# Patient Record
Sex: Male | Born: 1960 | Race: White | Hispanic: No | Marital: Married | Smoking: Never smoker
Health system: Southern US, Community
[De-identification: ages and names within clinical notes are randomized; demographics above are authoritative.]

## PROBLEM LIST (undated history)

## (undated) DIAGNOSIS — F319 Bipolar disorder, unspecified: Secondary | ICD-10-CM

## (undated) HISTORY — DX: Bipolar disorder, unspecified: F31.9

## (undated) HISTORY — PX: HERNIA REPAIR: SHX51

---

## 2014-08-02 ENCOUNTER — Encounter (HOSPITAL_COMMUNITY): Payer: Self-pay | Admitting: Psychiatry

## 2014-08-02 ENCOUNTER — Ambulatory Visit (INDEPENDENT_AMBULATORY_CARE_PROVIDER_SITE_OTHER): Payer: BC Managed Care – PPO | Admitting: Psychiatry

## 2014-08-02 ENCOUNTER — Encounter (INDEPENDENT_AMBULATORY_CARE_PROVIDER_SITE_OTHER): Payer: Self-pay

## 2014-08-02 VITALS — BP 136/79 | HR 75 | Ht 72.0 in | Wt 208.8 lb

## 2014-08-02 DIAGNOSIS — F310 Bipolar disorder, current episode hypomanic: Secondary | ICD-10-CM

## 2014-08-02 DIAGNOSIS — F1010 Alcohol abuse, uncomplicated: Secondary | ICD-10-CM

## 2014-08-02 DIAGNOSIS — F319 Bipolar disorder, unspecified: Secondary | ICD-10-CM

## 2014-08-02 MED ORDER — HYDROXYZINE PAMOATE 25 MG PO CAPS: 25.0000 mg | ORAL_CAPSULE | ORAL | Status: DC | PRN
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
## 2014-08-05 LAB — CBC WITH DIFFERENTIAL/PLATELET
BASOS PCT: 1 % (ref 0–1)
Basophils Absolute: 0.1 10*3/uL (ref 0.0–0.1)
EOS ABS: 0.1 10*3/uL (ref 0.0–0.7)
Eosinophils Relative: 2 % (ref 0–5)
HCT: 44.5 % (ref 39.0–52.0)
Hemoglobin: 15.9 g/dL (ref 13.0–17.0)
Lymphocytes Relative: 36 % (ref 12–46)
Lymphs Abs: 1.8 10*3/uL (ref 0.7–4.0)
MCH: 29.7 pg (ref 26.0–34.0)
MCHC: 35.7 g/dL (ref 30.0–36.0)
MCV: 83 fL (ref 78.0–100.0)
Monocytes Absolute: 0.4 10*3/uL (ref 0.1–1.0)
Monocytes Relative: 8 % (ref 3–12)
Neutro Abs: 2.7 10*3/uL (ref 1.7–7.7)
Neutrophils Relative %: 53 % (ref 43–77)
PLATELETS: 262 10*3/uL (ref 150–400)
RBC: 5.36 MIL/uL (ref 4.22–5.81)
RDW: 13.8 % (ref 11.5–15.5)
WBC: 5.1 10*3/uL (ref 4.0–10.5)

## 2014-08-05 LAB — COMPREHENSIVE METABOLIC PANEL
ALT: 18 U/L (ref 0–53)
AST: 17 U/L (ref 0–37)
Albumin: 4.6 g/dL (ref 3.5–5.2)
Alkaline Phosphatase: 63 U/L (ref 39–117)
BILIRUBIN TOTAL: 0.6 mg/dL (ref 0.2–1.2)
BUN: 11 mg/dL (ref 6–23)
CO2: 28 mEq/L (ref 19–32)
CREATININE: 0.91 mg/dL (ref 0.50–1.35)
Calcium: 10.4 mg/dL (ref 8.4–10.5)
Chloride: 106 mEq/L (ref 96–112)
Glucose, Bld: 89 mg/dL (ref 70–99)
Potassium: 4.4 mEq/L (ref 3.5–5.3)
Sodium: 140 mEq/L (ref 135–145)
Total Protein: 6.8 g/dL (ref 6.0–8.3)

## 2014-08-05 LAB — HEMOGLOBIN A1C
Hgb A1c MFr Bld: 5.1 % (ref <5.7)
Mean Plasma Glucose: 100 mg/dL (ref <117)

## 2014-08-05 LAB — LIPID PANEL
Cholesterol: 222 mg/dL — ABNORMAL HIGH (ref 0–200)
HDL: 45 mg/dL (ref >39)
LDL Cholesterol: 147 mg/dL — ABNORMAL HIGH (ref 0–99)
TRIGLYCERIDES: 151 mg/dL — AB (ref <150)
Total CHOL/HDL Ratio: 4.9 Ratio
VLDL: 30 mg/dL (ref 0–40)

## 2014-08-16 ENCOUNTER — Encounter (HOSPITAL_COMMUNITY): Payer: Self-pay | Admitting: Psychiatry

## 2014-08-16 ENCOUNTER — Ambulatory Visit (INDEPENDENT_AMBULATORY_CARE_PROVIDER_SITE_OTHER): Payer: BC Managed Care – PPO | Admitting: Psychiatry

## 2014-08-16 VITALS — BP 124/82 | HR 79 | Ht 72.0 in | Wt 210.0 lb

## 2014-08-16 DIAGNOSIS — F310 Bipolar disorder, current episode hypomanic: Secondary | ICD-10-CM

## 2014-08-16 DIAGNOSIS — F1010 Alcohol abuse, uncomplicated: Secondary | ICD-10-CM

## 2014-08-16 DIAGNOSIS — F319 Bipolar disorder, unspecified: Secondary | ICD-10-CM

## 2014-08-16 NOTE — Progress Notes (Signed)
[place] Progress Note  Tom [name] [id number] 53 y.o.  08/16/2014 11:21 AM  Chief Complaint:  I took Vistaril and it did help me sleep.  I'm feeling better.   History of Present Illness:  Tom [name] came for his follow-up appointment with his wife.  He was seen first time 3 weeks ago as initial evaluation.  Patient has history of bipolar disorder and recently moved from [place].  He wanted to establish care with a psychiatrist.  On his last visit he admitted irritability and some time frustration and anger and he was given Vistaril.  He is taking Lamictal 200 mg 2 tablet daily.  Today he brought bottles of the medication.  He has not taken Ativan in a bottle.  Patient and his wife admitted that he is doing very well on his medication.  He still have some time racing thoughts and decreased energy but he denies any anger, mood swing or any paranoia.  Patient continues to get distracted easily and his mood remains labile.  Patient is concerned about his pending DWI charges from December 2015.  He denies using any illegal substances.  He denies any suicidal parts or homicidal thought.  He denies any hallucination or any paranoia however he has some grandiosity.  He has no rash or itching.  Patient does not want to increase his medication but agreed to see a counselor for coping and social skills.  At this time his wife also agreed with the plan.  He had a blood work which I reviewed.  He has a high cholesterol.  He has not able to establish a primary care physician in this area but he is thinking to contact his wife's primary care physician for his health needs.  His appetite is okay.  His vitals are stable.  Patient lives with his wife who is very supportive.  He has no children.  Suicidal Ideation: No Plan Formed: No Patient has means to carry out plan: No  Homicidal Ideation: No Plan Formed: No Patient has means to carry out plan: No  Past Psychiatric  History/Hospitalization(s) Patient endorsed history of bipolar disorder since he was 53 years old.  He has at least one brief psychiatric hospitalization in [place] for 72 hours when he was very agitated aggressive and violent.  In the past he had tried Zoloft, Prozac and lithium with good response until it stopped working.  He had also tried Depakote, Abilify which did not help him.  Patient endorses history of severe mood swings, impulsive behavior, manic symptoms which includes excessive shopping, spending, delusions, grandiosity and hyperactivity.  He endorses history of punching in the walls when he is very aggressive involvement.  He has a history of legal issues in the past.  He was charged for disorderly conduct and recently DWI .  This is still pending in court.  Patient denies any history of suicidal attempt or any paranoia. Anxiety: Yes Bipolar Disorder: Yes Depression: Yes Mania: Yes Psychosis: Yes Schizophrenia: No Personality Disorder: No Hospitalization for psychiatric illness: Yes History of Electroconvulsive Shock Therapy: No Prior Suicide Attempts: No  Medical History; Patient has no active medical problems.  He is looking for a new primary care physician.  He denies any history of seizures.  Education and Work History; The patient has a college education.  Currently he is unemployed but he has worked in many areas.  He studied and then worked in [place].  He knows multiple language.  He also worked as a Personal assistant  agent, school teacher and in IT.  Review of Systems: Psychiatric: Agitation: Yes Hallucination: No Depressed Mood: Yes Insomnia: No Hypersomnia: No Altered Concentration: No Feels Worthless: No Grandiose Ideas: Yes Belief In Special Powers: No New/Increased Substance Abuse: Yes Compulsions: No  Neurologic: Headache: No Seizure: No Paresthesias: No   Musculoskeletal: Strength & Muscle Tone: within normal limits Gait & Station: normal Patient  leans: N/A   Outpatient Encounter Prescriptions as of 08/16/2014  Medication Sig  . hydrOXYzine (VISTARIL) 25 MG capsule Take 1 capsule (25 mg total) by mouth as needed for anxiety.  . lamoTRIgine (LAMICTAL) 200 MG tablet 400 mg daily.     Recent Results (from the past 2160 hour(s))  CBC WITH DIFFERENTIAL     Status: None   Collection Time    08/05/14 10:41 AM      Result Value Ref Range   WBC 5.1  4.0 - 10.5 K/uL   RBC 5.36  4.22 - 5.81 MIL/uL   Hemoglobin 15.9  13.0 - 17.0 g/dL   HCT 44.5  39.0 - 52.0 %   MCV 83.0  78.0 - 100.0 fL   MCH 29.7  26.0 - 34.0 pg   MCHC 35.7  30.0 - 36.0 g/dL   RDW 13.8  11.5 - 15.5 %   Platelets 262  150 - 400 K/uL   Neutrophils Relative % 53  43 - 77 %   Neutro Abs 2.7  1.7 - 7.7 K/uL   Lymphocytes Relative 36  12 - 46 %   Lymphs Abs 1.8  0.7 - 4.0 K/uL   Monocytes Relative 8  3 - 12 %   Monocytes Absolute 0.4  0.1 - 1.0 K/uL   Eosinophils Relative 2  0 - 5 %   Eosinophils Absolute 0.1  0.0 - 0.7 K/uL   Basophils Relative 1  0 - 1 %   Basophils Absolute 0.1  0.0 - 0.1 K/uL   Smear Review Criteria for review not met    COMPREHENSIVE METABOLIC PANEL     Status: None   Collection Time    08/05/14 10:41 AM      Result Value Ref Range   Sodium 140  135 - 145 mEq/L   Potassium 4.4  3.5 - 5.3 mEq/L   Chloride 106  96 - 112 mEq/L   CO2 28  19 - 32 mEq/L   Glucose, Bld 89  70 - 99 mg/dL   BUN 11  6 - 23 mg/dL   Creat 0.91  0.50 - 1.35 mg/dL   Total Bilirubin 0.6  0.2 - 1.2 mg/dL   Alkaline Phosphatase 63  39 - 117 U/L   AST 17  0 - 37 U/L   ALT 18  0 - 53 U/L   Total Protein 6.8  6.0 - 8.3 g/dL   Albumin 4.6  3.5 - 5.2 g/dL   Calcium 10.4  8.4 - 10.5 mg/dL  HEMOGLOBIN A1C     Status: None   Collection Time    08/05/14 10:41 AM      Result Value Ref Range   Hemoglobin A1C 5.1  <5.7 %   Comment:  According to the ADA Clinical Practice Recommendations for 2011, when      HbA1c is used as a screening test:             >=6.5%   Diagnostic of Diabetes Mellitus                (if abnormal result is confirmed)           5.7-6.4%   Increased risk of developing Diabetes Mellitus           References:Diagnosis and Classification of Diabetes Mellitus,Diabetes     YPPJ,0932,67(TIWPY 1):S62-S69 and Standards of Medical Care in             Diabetes - 2011,Diabetes [id number],3382,50 (Suppl 1):S11-S61.         Mean Plasma Glucose 100  <117 mg/dL  LIPID PANEL     Status: Abnormal   Collection Time    08/05/14 10:41 AM      Result Value Ref Range   Cholesterol 222 (*) 0 - 200 mg/dL   Comment: ATP III Classification:           < 200        mg/dL        Desirable          200 - 239     mg/dL        Borderline High          >= 240        mg/dL        High         Triglycerides 151 (*) <150 mg/dL   HDL 45  >39 mg/dL   Total CHOL/HDL Ratio 4.9     VLDL 30  0 - 40 mg/dL   LDL Cholesterol 147 (*) 0 - 99 mg/dL   Comment:       Total Cholesterol/HDL Ratio:CHD Risk                            Coronary Heart Disease Risk Table                                            Men       Women              1/2 Average Risk              3.4        3.3                  Average Risk              5.0        4.4               2X Average Risk              9.6        7.1               3X Average Risk             23.4       11.0     Use the calculated Patient Ratio above and the CHD Risk table      to determine the patient's CHD Risk.     ATP III Classification (LDL):           <  100        mg/dL         Optimal          100 - 129     mg/dL         Near or Above Optimal          130 - 159     mg/dL         Borderline High          160 - 189     mg/dL         High           > 190        mg/dL         Very High            Constitutional:  BP 124/82  Pulse 79  Ht 6' (1.829 m)  Wt 210 lb (95.255 kg)  BMI 28.47 kg/m2   Mental Status Examination;  Patient is casually dressed  and fairly groomed.  He appears to be in his stated age.  He is cooperative  And maintained fair eye contact.  His speech is fast  but clear and coherent.  His thought processes circumstantial.  His attention and concentration is distracted.  He appears grandiose.  He described his mood anxious and his affect is appropriate.   he uses jokes and humor during the conversation.  His fund of knowledge is good.  His psychomotor activity is slightly increased.  There were no paranoia or any delusions.   he denies any active or passive suicidal parts or homicidal thought.  There were no tremors or shakes.  He is alert and oriented x3.  His insight judgment and impulse control is okay.   Established Problem, Stable/Improving (1), Review of Psycho-Social Stressors (1), Review or order clinical lab tests (1), Review and summation of old records (2), Review of Last Therapy Session (1), Review of Medication Regimen & Side Effects (2) and Review of New Medication or Change in Dosage (2)  Assessment: Axis I: Bipolar disorder, type I.  Alcohol abuse  Axis II: Deferred  Axis III:  History reviewed. No pertinent past medical history.  Axis IV: Mild to moderate   Plan:   I am is still reading collateral information from his previous psychiatrist.  At this time patient and his 5 does not want to change the medication.  He has taken Vistaril a few times with good response.  Continue Lamictal 400 mg daily.  I review blood work with him.  I strongly encouraged him to see primary care physician for basic health needs and high cholesterol.  Patient agreed to see a counselor in this office for coping and social skills.  We will schedule appointment with a therapist in this office.  Discussed medication side effects and benefits.  I will see him again in 2 months.  Patient is still has enough refills for his Lamictal.  Recommended to call us back if he has any question or any concern.  Time spent 25 minutes.  More than 50%  of the time spent in psychoeducation, counseling and coordination of care.  Discuss safety plan that anytime having active suicidal thoughts or homicidal thoughts then patient need to call 911 or go to the local emergency room.    ARFEEN,SYED T., MD 08/16/2014

## 2014-10-27 ENCOUNTER — Encounter (HOSPITAL_COMMUNITY): Payer: Self-pay | Admitting: Psychiatry

## 2014-10-27 ENCOUNTER — Ambulatory Visit (INDEPENDENT_AMBULATORY_CARE_PROVIDER_SITE_OTHER): Payer: BC Managed Care – PPO | Admitting: Psychiatry

## 2014-10-27 VITALS — BP 144/84 | HR 80 | Ht 72.0 in | Wt 209.6 lb

## 2014-10-27 DIAGNOSIS — F1010 Alcohol abuse, uncomplicated: Secondary | ICD-10-CM

## 2014-10-27 DIAGNOSIS — F310 Bipolar disorder, current episode hypomanic: Secondary | ICD-10-CM

## 2014-10-27 NOTE — Progress Notes (Signed)
[facility] Progress Note  Tom [name] [id number] 54 y.o.  10/27/2014 9:49 AM  Chief Complaint:  I saw primary care physician but I'm not happy with him.  He is retiring soon.  I'm feeling better and I does not require any Vistaril.   History of Present Illness:  Tom [name] came for his follow-up appointment.  He establish care with his primary care physician Dr. Tawanna [name] in [place] however he is not happy because he is going to be retired very soon.  He was given Ativan 0.5 mg 3 times a day with 3 additional refills and Lamictal 200 mg twice a day with 7 refills.  Overall his mood has been stable.  He has not required any Vistaril in past 3 months.  He had a quiet Christmas.  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  Patient like him and he wants to work with his election campaign.  Overall his mood has been stable.  He denies any irritability, anger, mood swing.  He sleeping good.  He had a blood work again at his primary care physician and he was told that there is no need to start any strong medication.  He is a still waiting for court hearing related to his DWI charges.  However he is not very eager or anxious because he know the court process is very slow and he may not get his court hearing until September to see her.  We have suggested to see a therapist however he felt he does not needed and he did not come to the appointment.  His appetite is okay.  His vitals are stable.  Patient lives with his wife who is very supportive.  Patient has no children.  Patient denies drinking or using any illegal substances.  Suicidal Ideation: No Plan Formed: No Patient has means to carry out plan: No  Homicidal Ideation: No Plan Formed: No Patient has means to carry out plan: No  Past Psychiatric History/Hospitalization(s) Patient endorsed history of bipolar disorder since he was 54 years old.  He  has at least one brief psychiatric hospitalization in [place] for 72 hours when he was very agitated aggressive and violent.  In the past he had tried Zoloft, Prozac and lithium with good response until it stopped working.  He had also tried Depakote, Abilify which did not help him.  Patient endorses history of severe mood swings, impulsive behavior, manic symptoms which includes excessive shopping, spending, delusions, grandiosity and hyperactivity.  He endorses history of punching in the walls when he is very aggressive involvement.  He has a history of legal issues in the past.  He was charged for disorderly conduct and recently DWI .  Patient denies any history of suicidal attempt or any paranoia. Anxiety: Yes Bipolar Disorder: Yes Depression: Yes Mania: Yes Psychosis: Yes Schizophrenia: No Personality Disorder: No Hospitalization for psychiatric illness: Yes History of Electroconvulsive Shock Therapy: No Prior Suicide Attempts: No  Medical History; Patient has no active medical problems.  He establish care with Dr. Tawanna [name] in [place].  He denies any history of seizures.  Education and Work History; Patient has college education.  Currently he is unemployed but he is thinking to join presidential election complain for [employer].  He has a good job experience he has studied and work Financial controller . He knows multiple language.  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  Review of Systems: Psychiatric: Agitation: No Hallucination: No Depressed Mood: No Insomnia: No Hypersomnia: No Altered Concentration: No Feels Worthless: No Grandiose Ideas: Yes Belief In Special Powers: No New/Increased Substance Abuse: Yes Compulsions: No  Neurologic: Headache: No Seizure: No Paresthesias: No   Musculoskeletal: Strength & Muscle Tone: within normal limits Gait & Station: normal Patient leans: N/A   Outpatient Encounter Prescriptions as of 10/27/2014   Medication Sig  . hydrOXYzine (VISTARIL) 25 MG capsule Take 1 capsule (25 mg total) by mouth as needed for anxiety.  . lamoTRIgine (LAMICTAL) 200 MG tablet 400 mg daily.   Marland Kitchen LORazepam (ATIVAN) 0.5 MG tablet     Recent Results (from the past 2160 hour(s))  CBC with Differential     Status: None   Collection Time: 08/05/14 10:41 AM  Result Value Ref Range   WBC 5.1 4.0 - 10.5 K/uL   RBC 5.36 4.22 - 5.81 MIL/uL   Hemoglobin 15.9 13.0 - 17.0 g/dL   HCT 44.5 39.0 - 52.0 %   MCV 83.0 78.0 - 100.0 fL   MCH 29.7 26.0 - 34.0 pg   MCHC 35.7 30.0 - 36.0 g/dL   RDW 13.8 11.5 - 15.5 %   Platelets 262 150 - 400 K/uL   Neutrophils Relative % 53 43 - 77 %   Neutro Abs 2.7 1.7 - 7.7 K/uL   Lymphocytes Relative 36 12 - 46 %   Lymphs Abs 1.8 0.7 - 4.0 K/uL   Monocytes Relative 8 3 - 12 %   Monocytes Absolute 0.4 0.1 - 1.0 K/uL   Eosinophils Relative 2 0 - 5 %   Eosinophils Absolute 0.1 0.0 - 0.7 K/uL   Basophils Relative 1 0 - 1 %   Basophils Absolute 0.1 0.0 - 0.1 K/uL   Smear Review Criteria for review not met   Comprehensive metabolic panel     Status: None   Collection Time: 08/05/14 10:41 AM  Result Value Ref Range   Sodium 140 135 - 145 mEq/L   Potassium 4.4 3.5 - 5.3 mEq/L   Chloride 106 96 - 112 mEq/L   CO2 28 19 - 32 mEq/L   Glucose, Bld 89 70 - 99 mg/dL   BUN 11 6 - 23 mg/dL   Creat 0.91 0.50 - 1.35 mg/dL   Total Bilirubin 0.6 0.2 - 1.2 mg/dL   Alkaline Phosphatase 63 39 - 117 U/L   AST 17 0 - 37 U/L   ALT 18 0 - 53 U/L   Total Protein 6.8 6.0 - 8.3 g/dL   Albumin 4.6 3.5 - 5.2 g/dL   Calcium 10.4 8.4 - 10.5 mg/dL  Hemoglobin A1c     Status: None   Collection Time: 08/05/14 10:41 AM  Result Value Ref Range   Hgb A1c MFr Bld 5.1 <5.7 %    Comment:                                                                        According to the ADA Clinical Practice Recommendations for 2011, when HbA1c is used as a screening test:     >=6.5%   Diagnostic of Diabetes Mellitus             (if abnormal  result is confirmed)   5.7-6.4%   Increased risk of developing Diabetes Mellitus   References:Diagnosis and Classification of Diabetes Mellitus,Diabetes IPJA,2505,39(JQBHA 1):S62-S69 and Standards of Medical Care in         Diabetes - 2011,Diabetes LPFX,9024,09 (Suppl 1):S11-S61.     Mean Plasma Glucose 100 <117 mg/dL  Lipid panel     Status: Abnormal   Collection Time: 08/05/14 10:41 AM  Result Value Ref Range   Cholesterol 222 (H) 0 - 200 mg/dL    Comment: ATP III Classification:       < 200        mg/dL        Desirable      200 - 239     mg/dL        Borderline High      >= 240        mg/dL        High     Triglycerides 151 (H) <150 mg/dL   HDL 45 >39 mg/dL   Total CHOL/HDL Ratio 4.9 Ratio   VLDL 30 0 - 40 mg/dL   LDL Cholesterol 147 (H) 0 - 99 mg/dL    Comment:   Total Cholesterol/HDL Ratio:CHD Risk                        Coronary Heart Disease Risk Table                                        Men       Women          1/2 Average Risk              3.4        3.3              Average Risk              5.0        4.4           2X Average Risk              9.6        7.1           3X Average Risk             23.4       11.0 Use the calculated Patient Ratio above and the CHD Risk table  to determine the patient's CHD Risk. ATP III Classification (LDL):       < 100        mg/dL         Optimal      100 - 129     mg/dL         Near or Above Optimal      130 - 159     mg/dL         Borderline High      160 - 189     mg/dL         High       > 190        mg/dL         Very High        Constitutional:  BP 144/84 mmHg  Pulse 80  Ht 6' (1.829 m)  Wt 209 lb 9.6 oz (95.074 kg)  BMI 28.42 kg/m2  Mental Status Examination;  Patient is casually dressed and fairly groomed.  He appears to be in his stated age.  He is pleasant and cooperative.  He maintained fair eye contact.  His speech is fast  but clear and coherent.  His thought processes is logical.   His attention and concentration is distracted.  He appears grandiose.  He described his mood anxious and his affect is appropriate.   He uses jokes and humor during the conversation.  His fund of knowledge is good.  His psychomotor activity is slightly increased.  There were no paranoia or any delusions.   he denies any active or passive suicidal parts or homicidal thought.  There were no tremors or shakes.  He is alert and oriented x3.  His insight judgment and impulse control is okay.   Established Problem, Stable/Improving (1), Review of Last Therapy Session (1) and Review of Medication Regimen & Side Effects (2)  Assessment: Axis I: Bipolar disorder, type I.  Alcohol abuse  Axis II: Deferred  Axis III:  History reviewed. No pertinent past medical history.  Axis IV: Mild to moderate   Plan:  We are still awaiting collateral information from his previous psychiatrist.  His Lamictal 200 mg twice a day is recently renewed by his primary care physician with additional 7 refills along with Ativan 0.5 mg 3 times a day with 3 more refills.  However patient is not taking Ativan every day and he does not require any new prescription of hydroxyzine.  Patient does not have any rash or itching with the Lamictal.  One more time I suggested to see a therapist if he feel that he need counseling.  He will call us back to schedule appointment if needed.  I will see him again in 3 months.  Patient does not require any refills for at least 7 months.  Recommended to call us back if he has any question, concern or if he feel worsening of the symptoms.   ARFEEN,SYED T., MD 10/27/2014

## 2014-11-10 ENCOUNTER — Encounter (HOSPITAL_COMMUNITY): Payer: Self-pay | Admitting: Clinical

## 2014-11-10 ENCOUNTER — Ambulatory Visit (INDEPENDENT_AMBULATORY_CARE_PROVIDER_SITE_OTHER): Payer: BC Managed Care – PPO | Admitting: Clinical

## 2014-11-10 DIAGNOSIS — F3110 Bipolar disorder, current episode manic without psychotic features, unspecified: Secondary | ICD-10-CM | POA: Diagnosis not present
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
## 2014-11-24 ENCOUNTER — Ambulatory Visit (HOSPITAL_COMMUNITY): Payer: BC Managed Care – PPO | Admitting: Clinical

## 2014-12-08 ENCOUNTER — Encounter (HOSPITAL_COMMUNITY): Payer: Self-pay | Admitting: Clinical

## 2014-12-08 ENCOUNTER — Ambulatory Visit (INDEPENDENT_AMBULATORY_CARE_PROVIDER_SITE_OTHER): Payer: BC Managed Care – PPO | Admitting: Clinical

## 2014-12-08 DIAGNOSIS — F3110 Bipolar disorder, current episode manic without psychotic features, unspecified: Secondary | ICD-10-CM

## 2014-12-08 NOTE — Progress Notes (Signed)
   THERAPIST PROGRESS NOTE  Session Time: 8:05 - 9:05  Participation Level: Active  Behavioral Response: Casual and NeatAlertDepressed  Type of Therapy: Individual Therapy  Treatment Goals addressed: psychiatric symptoms, emotional regulation skills,   Interventions: Motivational Interviewing and Other: Grounding and mindfulness techniques  Summary: Tom [name] is a 54 y.o. male who presents with Bipolar I.   Suicidal/Homicidal: Nowithout intent/plan  Therapist Response:  Vance Peper) met with clinician for an individual session. [name] was accompanied to this session by his wife Hartland. [name] discussed his psychiatric symptoms and current life events. He shared that he was  Doing "okay" this week. He asked his wife to share her thoughts and insights about his diagnosis. She shared that she can often recognize when is he is about to have a manic episode.[name] discussed his thoughts and insights about what his wife shared. Client, client wife and clinician discussed the signs and symptoms. [name] shared that the in the beginning he will have lots of ideas, become more chatty, get careless with money, and stop sleeping.  Client and clinician discussed the importance of taking his medication diligently as prescribed. Client and clinician discussed healthy routines - food, exercise and sleep - and how they can help him stay in balance. Clinician gave client two modules on bipolar disorder which client agreed to read and complete before next session.  Clinician introduced grounding and mindfulness techniques. Clinician explained the purpose and application.  Client, client wife, and clinician practiced some together and [name] agreed to practice them until next session.   Plan: Return again in 2 weeks.  Diagnosis: Axis I: Bipolar I     Chriss Mannan A, LCSW 12/08/2014

## 2014-12-22 ENCOUNTER — Ambulatory Visit (INDEPENDENT_AMBULATORY_CARE_PROVIDER_SITE_OTHER): Payer: BC Managed Care – PPO | Admitting: Clinical

## 2014-12-22 DIAGNOSIS — F319 Bipolar disorder, unspecified: Secondary | ICD-10-CM

## 2014-12-23 ENCOUNTER — Encounter (HOSPITAL_COMMUNITY): Payer: Self-pay | Admitting: Clinical

## 2014-12-23 NOTE — Progress Notes (Signed)
   THERAPIST PROGRESS NOTE  Session Time: 9:00 - 9:55  Participation Level: Active  Behavioral Response: Casual and NeatAlertDepressed  Type of Therapy: Individual Therapy  Treatment Goals addressed: improve psychiatric symptoms, change faulty thoughts, emotional regulations skills  Interventions: CBT and Motivational Interviewing  Summary: Tom [name] is [name] 54 y.o. male who presents with Bipolar I.   Suicidal/Homicidal: Nowithout intent/plan  Therapist Response:  Tom [name] met with clinician for an individual session. Tom [name] discussed his psychiatric symptoms and his homework. Tom [name] shared that he had been feeling "blah" for the last four days. He shared that it is [name] feeling of numbness and not caring. He shared that he did not feel especially depressed but he was unmotivated to do anything and that his thoughts were slow. Client and clinician discussed what healthy coping skills he was using. Tom [name] shared that he was just accepting his emotion. Clinician introduced [name] 7 panel  cbt thought record sheet. Client and clinician used Tom [name]'s current emotional state as an example. Tom [name] identified the situation or trigger as feeling blah. He then identified his emotions and rated them. Tom [name] then Identified his  unhelpful thought. Client and clinician discussed the evidence for and against these thoughts. Tom [name] then formulated healthier alternative thoughts. Tom [name] shared that if he were to focus on the second set of thoughts his negative emotions would go down and he would be more likely to take actions that might help his mood. Tom [name] agreed to complete some homework (cbt ) on bipolar disorder.  Plan: Return again in 1-2 weeks.  Diagnosis: Axis I: Bipolar I        Tom Torry A, LCSW 12/23/2014

## 2015-01-05 ENCOUNTER — Ambulatory Visit (HOSPITAL_COMMUNITY): Payer: BC Managed Care – PPO | Admitting: Clinical

## 2015-01-27 ENCOUNTER — Ambulatory Visit (INDEPENDENT_AMBULATORY_CARE_PROVIDER_SITE_OTHER): Payer: BC Managed Care – PPO | Admitting: Psychiatry

## 2015-01-27 ENCOUNTER — Encounter (HOSPITAL_COMMUNITY): Payer: Self-pay | Admitting: Psychiatry

## 2015-01-27 VITALS — BP 132/82 | HR 101 | Ht 72.0 in | Wt 209.6 lb

## 2015-01-27 DIAGNOSIS — F1010 Alcohol abuse, uncomplicated: Secondary | ICD-10-CM

## 2015-01-27 DIAGNOSIS — F310 Bipolar disorder, current episode hypomanic: Secondary | ICD-10-CM

## 2015-01-27 NOTE — Progress Notes (Signed)
[facility] [phone] Progress Note  Tom [name] [id number] 54 y.o.  01/27/2015 9:05 AM  Chief Complaint:  Medication management and follow-up.    History of Present Illness:  Tom [name] came for his follow-up appointment.  He is taking Lamictal 200 mg twice a day.  He denies any side effects including any rash or itching.  He is no longer taking Ativan and Vistaril.  He believed his anxiety is well controlled.  He still have some irritability, grandiosity but he denies any aggression violence or any suicidal thoughts.  Patient told that he's been dealing with his illness for a lot of time and he knows how to handle it.  He started counseling with Scarlette Calico but believe it does not required .  He is getting Lamictal from his previous provider and he is still has a lot of refills.  Patient denies any rash or itching.  Patient denies any paranoia, hallucination or any self abusive behavior.  He is thinking to get retired however he has no time frame at this time.  Patient is a still waiting for the court hearing related to his DUI charges.  Patient lives with his wife is very supportive.  His appetite is okay.  His vitals are stable.  Suicidal Ideation: No Plan Formed: No Patient has means to carry out plan: No  Homicidal Ideation: No Plan Formed: No Patient has means to carry out plan: No  Past Psychiatric History/Hospitalization(s) Patient endorsed history of bipolar disorder since he was 54 years old.  He has at least one brief psychiatric hospitalization in [place] for 72 hours when he was very agitated aggressive and violent.  In the past he had tried Zoloft, Prozac and lithium with good response until it stopped working.  He had also tried Depakote, Abilify which did not help him.  He was also given Vistaril and Ativan for anxiety however he stopped taking it.  Patient endorses history of severe mood swings, impulsive behavior, manic symptoms which includes excessive shopping, spending,  delusions, grandiosity and hyperactivity.  He endorses history of punching in the walls when he is very aggressive involvement.  He has a history of legal issues in the past.  He was charged for disorderly conduct and recently DWI .  Patient denies any history of suicidal attempt or any paranoia. Anxiety: Yes Bipolar Disorder: Yes Depression: Yes Mania: Yes Psychosis: Yes Schizophrenia: No Personality Disorder: No Hospitalization for psychiatric illness: Yes History of Electroconvulsive Shock Therapy: No Prior Suicide Attempts: No  Medical History; Patient has no active medical problems.  He establish care with Dr. Lula Olszewski in [place].  He denies any history of seizures.  Education and Work History; Patient has college education.  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 He knows multiple language.  He worked as a Customer service manager, Engineer, site and in Consulting civil engineer.  Review of Systems: Psychiatric: Agitation: No Hallucination: No Depressed Mood: No Insomnia: No Hypersomnia: No Altered Concentration: No Feels Worthless: No Grandiose Ideas: Yes Belief In Special Powers: No New/Increased Substance Abuse: No Compulsions: No  Neurologic: Headache: No Seizure: No Paresthesias: No   Musculoskeletal: Strength & Muscle Tone: within normal limits Gait & Station: normal Patient leans: N/A   Outpatient Encounter Prescriptions as of 01/27/2015  Medication Sig  . lamoTRIgine (LAMICTAL) 200 MG tablet 400 mg daily.   . [DISCONTINUED] hydrOXYzine (VISTARIL) 25 MG capsule Take 1 capsule (25 mg total) by mouth as needed for anxiety. (Patient not taking:  Reported on 11/10/2014)  . [DISCONTINUED] LORazepam (ATIVAN) 0.5 MG tablet     No results found for this or any previous visit (from the past 2160 hour(s)).    Constitutional:  BP 132/82 mmHg  Pulse 101  Ht 6' (1.829 m)  Wt 209 lb 9.6 oz (95.074 kg)  BMI 28.42 kg/m2   Mental Status Examination;   Patient is casually dressed and fairly groomed.  He is anxious but cooperative.  He maintained fair eye contact.  His speech is fast  but clear and coherent.  His thought processes is logical.  His attention and concentration is fair.  He appears grandiose.  He described his mood anxious and his affect is appropriate.   He uses jokes and humor during the conversation.  His fund of knowledge is good.  His psychomotor activity is slightly increased.  There were no paranoia or any delusions.   he denies any active or passive suicidal parts or homicidal thought.  There were no tremors or shakes.  He is alert and oriented x3.  His insight judgment and impulse control is okay.   Established Problem, Stable/Improving (1), Review of Last Therapy Session (1) and Review of Medication Regimen & Side Effects (2)  Assessment: Axis I: Bipolar disorder, type I.  Alcohol abuse  Axis II: Deferred  Axis III:  Past Medical History  Diagnosis Date  . Bipolar disorder     Plan:  Patient is no longer taking Ativan and Vistaril for more than 6 months.  He likes to continue Lamictal 200 mg twice a day.  He has no rash or itching.  He is also not interested in counseling.  Discussed medication side effects and benefits.  Recommended to call us back if he has any question, concern or if he feel worsening of the symptom.  He still has additional refills remaining for his Lamictal.  He will call us once he about ran out.  We are still awaiting collateral information from his previous psychiatrist.  Follow-up in 3 months.  Jenavive Lamboy T., MD 01/27/2015

## 2015-03-18 ENCOUNTER — Telehealth (HOSPITAL_COMMUNITY): Payer: Self-pay

## 2015-03-18 NOTE — Telephone Encounter (Signed)
Telephone message left for patient after he left a message at 3:14pm requesting Dr. Lolly MustacheArfeen provide him with a refill of Ativan.  Patient's last evaluation note from 01/27/15 stated patient was no longer taking Ativan and had been off of it for over 6 months at the time.  Attempted to reach patient to follow up on need as patient stated on message if he did not get medicine he may have to check himself into the hospital.  Requested patient call this nurse back if obtained the message before closing and instructed patient Dr. Lolly MustacheArfeen would be back in the office on Tuesday 03/22/15.  Instructed patient on message if he felt the need to be evaluated he could come [facility] for an evaluation 24/7 or to any of the EDs.

## 2015-03-23 NOTE — Telephone Encounter (Signed)
Attempted to reach patient but had to leave a message for call back.

## 2015-04-28 ENCOUNTER — Ambulatory Visit (HOSPITAL_COMMUNITY): Payer: BC Managed Care – PPO | Admitting: Psychiatry

## 2015-06-02 ENCOUNTER — Ambulatory Visit (INDEPENDENT_AMBULATORY_CARE_PROVIDER_SITE_OTHER): Payer: BC Managed Care – PPO | Admitting: Psychiatry

## 2015-06-02 ENCOUNTER — Encounter (HOSPITAL_COMMUNITY): Payer: Self-pay | Admitting: Psychiatry

## 2015-06-02 VITALS — BP 124/76 | HR 64 | Ht 72.0 in | Wt 196.2 lb

## 2015-06-02 DIAGNOSIS — F310 Bipolar disorder, current episode hypomanic: Secondary | ICD-10-CM

## 2015-06-02 DIAGNOSIS — F1021 Alcohol dependence, in remission: Secondary | ICD-10-CM | POA: Diagnosis not present

## 2015-06-02 MED ORDER — LAMOTRIGINE 200 MG PO TABS: 400.0000 mg | ORAL_TABLET | Freq: Every day | ORAL | Status: DC

## 2015-06-02 NOTE — Progress Notes (Signed)
[facility] [id number] Progress Note  Tom [name] [id number] 54 y.o.  06/02/2015 8:48 AM  Chief Complaint:  Medication management and follow-up.    History of Present Illness:  Tom [name] came for his follow-up appointment.  He was last seen in April.  She is taking Lamictal 400 mg daily.  He has no rash or itching.  He still have episodes of irritability and frustration but he denies any major mood swings or anger issues.  He sleeping good.  He has no rash or itching.  He has no major panic attack in recent months.  Patient knows his illness very well and he believed that he can control his episodic irritability very well.  He denies any paranoia or any hallucination.  He denies any feeling of hopelessness or any anhedonia.  He denies any recent impulsive behavior or any manic symptoms. He's been active social and doing regular exercise.  He lost weight from the past.  His appetite is okay.  Currently he is not working but patient to plan that he does not need to work.  He has no children and he lives with his wife who is very supportive.  Patient denies drinking or using any illegal substances.  His been looking for primary care physician.  He reported that he did not connect with Dr. Doristine Counter very well.  His last blood work was done in October which was normal..  Suicidal Ideation: No Plan Formed: No Patient has means to carry out plan: No  Homicidal Ideation: No Plan Formed: No Patient has means to carry out plan: No  Past Psychiatric History/Hospitalization(s) Patient has bipolar disorder since age 38.  He has at least one brief psychiatric hospitalization in [place] for 72 hours when he was very agitated aggressive and violent.  In the past he had tried Zoloft, Prozac and lithium with good response until it stopped working.  He had also tried Depakote, Abilify which did not help him.  He was also given Vistaril and Ativan for anxiety however he stopped taking it.  Patient endorses  history of severe mood swings, impulsive behavior, manic symptoms which includes excessive shopping, spending, delusions, grandiosity and hyperactivity.  He endorses history of punching in the walls when he is very aggressive involvement.  He has a history of legal issues in the past.  He was charged for disorderly conduct and DWI .  Patient denies any history of suicidal attempt or any paranoia. Anxiety: Yes Bipolar Disorder: Yes Depression: Yes Mania: Yes Psychosis: Yes Schizophrenia: No Personality Disorder: No Hospitalization for psychiatric illness: Yes History of Electroconvulsive Shock Therapy: No Prior Suicide Attempts: No  Medical History; Patient has no active medical problems.  He has seen in the past Dr. Lula Olszewski in [place] but now looking for a new primary care physician. He denies any history of seizures.  Review of Systems: Psychiatric: Agitation: No Hallucination: No Depressed Mood: No Insomnia: No Hypersomnia: No Altered Concentration: No Feels Worthless: No Grandiose Ideas: Yes Belief In Special Powers: No New/Increased Substance Abuse: No Compulsions: No  Neurologic: Headache: No Seizure: No Paresthesias: No   Musculoskeletal: Strength & Muscle Tone: within normal limits Gait & Station: normal Patient leans: N/A   Outpatient Encounter Prescriptions as of 06/02/2015  Medication Sig  . lamoTRIgine (LAMICTAL) 200 MG tablet Take 2 tablets (400 mg total) by mouth daily.  . [DISCONTINUED] lamoTRIgine (LAMICTAL) 200 MG tablet 400 mg daily.    No facility-administered encounter medications on file as of 06/02/2015.  No results found for this or any previous visit (from the past 2160 hour(s)).    Constitutional:  BP 124/76 mmHg  Pulse 64  Ht 6' (1.829 m)  Wt 196 lb 3.2 oz (88.996 kg)  BMI 26.60 kg/m2  Psychiatric Specialty Exam: Physical Exam  Review of Systems  Constitutional: Positive for weight loss.  Musculoskeletal: Negative.    Skin: Negative for itching and rash.  Neurological: Negative for dizziness and tremors.    There were no vitals taken for this visit.There is no weight on file to calculate BMI.  General Appearance: Casual  Eye Contact::  Good  Speech:  Normal Rate  Volume:  Normal  Mood:  Euthymic  Affect:  Appropriate  Thought Process:  Coherent  Orientation:  Full (Time, Place, and Person)  Thought Content:  WDL  Suicidal Thoughts:  No  Homicidal Thoughts:  No  Memory:  Immediate;   Fair Recent;   Fair Remote;   Good  Judgement:  Good  Insight:  Good  Psychomotor Activity:  Normal  Concentration:  Good  Recall:  Good  Fund of Knowledge:  Good  Language:  Good  Akathisia:  No  Handed:  Right  AIMS (if indicated):     Assets:  Communication Skills Desire for Improvement Financial Resources/Insurance Housing Physical Health Social Support  ADL's:  Intact  Cognition:  WNL  Sleep:      Established Problem, Stable/Improving (1), Review of Last Therapy Session (1) and Review of Medication Regimen & Side Effects (2)  Assessment: Axis I: Bipolar disorder, type I.  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 Bipolar disorder     Plan:  Patient is stable on his Lamictal.  He has no rash or itching.  He is looking for a new primary care physician.  I have provided contact information for Dr. Rene Paci at [facility] for physical checkup.  I will continue Lamictal 200 mg 2 tablet daily. He is also not interested in counseling.  Discussed medication side effects and benefits.  Recommended to call us back if he has any question, concern or if he feel worsening of the symptom.  Follow-up in 3 months.  Garnett Rekowski T., MD 06/02/2015

## 2015-09-05 ENCOUNTER — Ambulatory Visit (INDEPENDENT_AMBULATORY_CARE_PROVIDER_SITE_OTHER): Payer: BC Managed Care – PPO | Admitting: Psychiatry

## 2015-09-05 ENCOUNTER — Encounter (HOSPITAL_COMMUNITY): Payer: Self-pay | Admitting: Psychiatry

## 2015-09-05 VITALS — BP 136/84 | HR 60 | Ht 72.0 in | Wt 193.6 lb

## 2015-09-05 DIAGNOSIS — F310 Bipolar disorder, current episode hypomanic: Secondary | ICD-10-CM

## 2015-09-05 DIAGNOSIS — F1021 Alcohol dependence, in remission: Secondary | ICD-10-CM

## 2015-09-05 MED ORDER — LAMOTRIGINE 200 MG PO TABS: 400.0000 mg | ORAL_TABLET | Freq: Every day | ORAL | Status: DC

## 2015-09-05 NOTE — Progress Notes (Signed)
[facility] [id number] Progress Note  Tom GlassRobert [name] [id number] 54 y.o.  09/05/2015 8:59 AM  Chief Complaint:  Medication management and follow-up.    History of Present Illness:  Tom MaduroRobert came for his follow-up appointment.  He admitted that he's been disappointed from the election results.  In the beginning he has difficulty sleeping but now he is feeling better.  He is planning to [place].  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  He is taking his medication and denies any side effects including any rash, itching or any headaches.  He had decided to keep his primary care physician due to insurance reason.  He denies any irritability, anger, mood swing.  He denies any mania or any psychosis.  He remains sober from alcohol and he feels proud of it.  He denies any feeling of hopelessness or worthlessness.  He has no delusions or any aggressive behavior.  His appetite is okay.  His vitals are stable.  Patient lives with his wife.  He has no children was very supportive.  He denies any illegal substance use.  Suicidal Ideation: No Plan Formed: No Patient has means to carry out plan: No  Homicidal Ideation: No Plan Formed: No Patient has means to carry out plan: No  Past Psychiatric History/Hospitalization(s) Patient has bipolar disorder since age 54.  He has at least one brief psychiatric hospitalization in [place] for 72 hours when he was very agitated aggressive and violent.  In the past he had tried Zoloft, Prozac and lithium with good response until it stopped working.  He had also tried Depakote, Abilify which did not help him.  He was also given Vistaril and Ativan for anxiety however he stopped taking it.  Patient endorses history of severe mood swings, impulsive behavior, manic symptoms which includes excessive shopping, spending, delusions, grandiosity and hyperactivity.  He endorses history of punching in the  walls when he is very aggressive involvement.  He has a history of legal issues in the past.  He was charged for disorderly conduct and DWI .  Patient denies any history of suicidal attempt or any paranoia. Anxiety: Yes Bipolar Disorder: Yes Depression: Yes Mania: Yes Psychosis: Yes Schizophrenia: No Personality Disorder: No Hospitalization for psychiatric illness: Yes History of Electroconvulsive Shock Therapy: No Prior Suicide Attempts: No  Medical History; Patient has no active medical problems.  He has seen in the past Dr. Lula OlszewskiBrett Burnett in [place] but now looking for a new primary care physician. He denies any history of seizures.  Review of Systems: Psychiatric: Agitation: No Hallucination: No Depressed Mood: No Insomnia: No Hypersomnia: No Altered Concentration: No Feels Worthless: No Grandiose Ideas: No Belief In Special Powers: No New/Increased Substance Abuse: No Compulsions: No  Neurologic: Headache: No Seizure: No Paresthesias: No   Musculoskeletal: Strength & Muscle Tone: within normal limits Gait & Station: normal Patient leans: N/A   Outpatient Encounter Prescriptions as of 09/05/2015  Medication Sig  . lamoTRIgine (LAMICTAL) 200 MG tablet Take 2 tablets (400 mg total) by mouth daily.  . naproxen (NAPROSYN) 250 MG tablet Take by mouth as needed.  . [DISCONTINUED] lamoTRIgine (LAMICTAL) 200 MG tablet Take 2 tablets (400 mg total) by mouth daily.   No facility-administered encounter medications on file as of 09/05/2015.    No results found for this or any previous visit (from the past 2160 hour(s)).    Constitutional:  BP 136/84 mmHg  Pulse 60  Ht 6' (1.829 m)  Wt 193 lb 9.6 oz (87.816 kg)  BMI 26.25 kg/m2  Psychiatric Specialty Exam: Physical Exam  Review of Systems  Musculoskeletal: Negative.   Skin: Negative for itching and rash.  Neurological: Negative for dizziness and tremors.    Blood pressure 136/84, pulse 60, height 6'  (1.829 m), weight 193 lb 9.6 oz (87.816 kg).Body mass index is 26.25 kg/(m^2).  General Appearance: Casual  Eye Contact::  Good  Speech:  Normal Rate  Volume:  Normal  Mood:  Euthymic  Affect:  Appropriate  Thought Process:  Coherent  Orientation:  Full (Time, Place, and Person)  Thought Content:  WDL  Suicidal Thoughts:  No  Homicidal Thoughts:  No  Memory:  Immediate;   Fair Recent;   Fair Remote;   Good  Judgement:  Good  Insight:  Good  Psychomotor Activity:  Normal  Concentration:  Good  Recall:  Good  Fund of Knowledge:  Good  Language:  Good  Akathisia:  No  Handed:  Right  AIMS (if indicated):     Assets:  Communication Skills Desire for Improvement Financial Resources/Insurance Housing Physical Health Social Support  ADL's:  Intact  Cognition:  WNL  Sleep:      Established Problem, Stable/Improving (1), Review of Last Therapy Session (1) and Review of Medication Regimen & Side Effects (2)  Assessment: Axis I: Bipolar disorder, type I.  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 Bipolar disorder (HCC)     Plan:  Patient is stable on his Lamictal.  He wants to continue current dose of Lamictal.  He has no rash or itching.  I will continue Lamictal 200 mg 2 tablet daily. He is also not interested in counseling.  Discussed medication side effects and benefits.  Recommended to call us back if he has any question, concern or if he feel worsening of the symptom.  Follow-up in 3 months.  Tom [name] T., MD 09/05/2015

## 2015-11-21 ENCOUNTER — Other Ambulatory Visit (HOSPITAL_COMMUNITY): Payer: Self-pay | Admitting: Psychiatry

## 2015-11-22 ENCOUNTER — Other Ambulatory Visit (HOSPITAL_COMMUNITY): Payer: Self-pay | Admitting: Psychiatry

## 2015-12-06 ENCOUNTER — Ambulatory Visit (INDEPENDENT_AMBULATORY_CARE_PROVIDER_SITE_OTHER): Payer: BC Managed Care – PPO | Admitting: Psychiatry

## 2015-12-06 ENCOUNTER — Encounter (HOSPITAL_COMMUNITY): Payer: Self-pay | Admitting: Psychiatry

## 2015-12-06 VITALS — BP 134/76 | HR 72 | Ht 72.0 in | Wt 182.2 lb

## 2015-12-06 DIAGNOSIS — F1021 Alcohol dependence, in remission: Secondary | ICD-10-CM | POA: Diagnosis not present

## 2015-12-06 DIAGNOSIS — F310 Bipolar disorder, current episode hypomanic: Secondary | ICD-10-CM

## 2015-12-06 MED ORDER — LAMOTRIGINE 200 MG PO TABS: 400.0000 mg | ORAL_TABLET | Freq: Every day | ORAL | Status: DC

## 2015-12-06 NOTE — Progress Notes (Signed)
[facility] [id number] Progress Note  Tom [name] [id number] 55 y.o.  12/06/2015 9:16 AM  Chief Complaint:  Medication management and follow-up.    History of Present Illness:  Tom [name] came for his follow-up appointment.  He is taking his medication as prescribed.  He denies any irritability, anger, mood swing.  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  He recently seen his primary care physician Dr. Haywood Pao and had blood work.  His cholesterol was high and now he is taking statin .  He denies any rash itching headaches or any shakes.  He likes Lamictal.  He denies any paranoia or any hallucination.  Initially he was disappointed from election results but lately he is been more involved in exercise walking and pleased that he has lost weight from the past.  His energy level is good.  He denies any aggressive behavior, mania or any delusions.  Patient lives with his wife who is very supportive.  He is excited about upcoming trip to [place] in May with his wife.  Patient denies drinking or using any illegal substances.  He has no children.  Suicidal Ideation: No Plan Formed: No Patient has means to carry out plan: No  Homicidal Ideation: No Plan Formed: No Patient has means to carry out plan: No  Past Psychiatric History/Hospitalization(s) Patient has bipolar disorder since age 53.  He has at least one brief psychiatric hospitalization in [place] for 72 hours when he was very agitated aggressive and violent.  In the past he had tried Zoloft, Prozac and lithium with good response until it stopped working.  He had also tried Depakote, Abilify which did not help him.  He was also given Vistaril and Ativan for anxiety however he stopped taking it.  Patient endorses history of severe mood swings, impulsive behavior, manic symptoms which includes excessive shopping, spending, delusions, grandiosity and hyperactivity.  He endorses history of punching in the walls when he is very aggressive  involvement.  He has a history of legal issues in the past.  He was charged for disorderly conduct and DWI .  Patient denies any history of suicidal attempt or any paranoia. Anxiety: Yes Bipolar Disorder: Yes Depression: Yes Mania: Yes Psychosis: Yes Schizophrenia: No Personality Disorder: No Hospitalization for psychiatric illness: Yes History of Electroconvulsive Shock Therapy: No Prior Suicide Attempts: No  Medical History; Patient has no active medical problems.  He has seen in the past Dr. Lula Olszewski in [place] but now looking for a new primary care physician. He denies any history of seizures.  Review of Systems: Psychiatric: Agitation: No Hallucination: No Depressed Mood: No Insomnia: No Hypersomnia: No Altered Concentration: No Feels Worthless: No Grandiose Ideas: No Belief In Special Powers: No New/Increased Substance Abuse: No Compulsions: No  Neurologic: Headache: No Seizure: No Paresthesias: No   Musculoskeletal: Strength & Muscle Tone: within normal limits Gait & Station: normal Patient leans: N/A   Outpatient Encounter Prescriptions as of 12/06/2015  Medication Sig  . atorvastatin (LIPITOR) 10 MG tablet 10 mg.  . lamoTRIgine (LAMICTAL) 200 MG tablet Take 2 tablets (400 mg total) by mouth daily.  . naproxen (NAPROSYN) 250 MG tablet Take by mouth as needed.  . [DISCONTINUED] lamoTRIgine (LAMICTAL) 200 MG tablet Take 2 tablets (400 mg total) by mouth daily.   No facility-administered encounter medications on file as of 12/06/2015.    No results found for this or any previous visit (from the past 2160 hour(s)).    Constitutional:  BP 134/76 mmHg  Pulse 72  Ht 6' (1.829 m)  Wt 182 lb 3.2 oz (82.645 kg)  BMI 24.71 kg/m2  Psychiatric Specialty Exam: Physical Exam  Review of Systems  Musculoskeletal: Negative.   Skin: Negative for itching and rash.  Neurological: Negative for dizziness and tremors.    Blood pressure 134/76, pulse 72,  height 6' (1.829 m), weight 182 lb 3.2 oz (82.645 kg).Body mass index is 24.71 kg/(m^2).  General Appearance: Casual  Eye Contact::  Good  Speech:  Normal Rate  Volume:  Normal  Mood:  Euthymic  Affect:  Appropriate  Thought Process:  Coherent  Orientation:  Full (Time, Place, and Person)  Thought Content:  WDL  Suicidal Thoughts:  No  Homicidal Thoughts:  No  Memory:  Immediate;   Fair Recent;   Fair Remote;   Good  Judgement:  Good  Insight:  Good  Psychomotor Activity:  Normal  Concentration:  Good  Recall:  Good  Fund of Knowledge:  Good  Language:  Good  Akathisia:  No  Handed:  Right  AIMS (if indicated):     Assets:  Communication Skills Desire for Improvement Financial Resources/Insurance Housing Physical Health Social Support  ADL's:  Intact  Cognition:  WNL  Sleep:      Established Problem, Stable/Improving (1), Review or order clinical lab tests (1), Review of Last Therapy Session (1) and Review of Medication Regimen & Side Effects (2)  Assessment: Axis I: Bipolar disorder, type I.  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 Bipolar disorder (HCC)     Plan:  I reviewed labs which was done in January 2017.  He is taking cholesterol-lowering medication and he will do scheduled to have another blood work in few weeks.  He is stable on Lamictal.  He has no rash itching or side effects.  He wants to continue Lamictal 200 mg 2 tablet daily.  Discussed medication side effects and benefits.  Encouraged to continue exercise and other walking.  Recommended to call us back if he has any question or any concern.  Follow-up in 3 months.  Ayanah Snader T., MD 12/06/2015

## 2016-02-15 ENCOUNTER — Encounter (HOSPITAL_COMMUNITY): Payer: Self-pay | Admitting: Psychiatry

## 2016-02-15 ENCOUNTER — Ambulatory Visit (INDEPENDENT_AMBULATORY_CARE_PROVIDER_SITE_OTHER): Payer: BC Managed Care – PPO | Admitting: Psychiatry

## 2016-02-15 VITALS — BP 130/78 | HR 68 | Ht 72.0 in | Wt 181.8 lb

## 2016-02-15 DIAGNOSIS — F1021 Alcohol dependence, in remission: Secondary | ICD-10-CM

## 2016-02-15 DIAGNOSIS — F310 Bipolar disorder, current episode hypomanic: Secondary | ICD-10-CM | POA: Diagnosis not present

## 2016-02-15 MED ORDER — LAMOTRIGINE 200 MG PO TABS: 400.0000 mg | ORAL_TABLET | Freq: Every day | ORAL | Status: DC

## 2016-02-15 NOTE — Progress Notes (Signed)
[facility] [id number] Progress Note  Tom GlassRobert [name] [id number] 55 y.o.  02/15/2016 9:41 AM  Chief Complaint:  Medication management and follow-up.    History of Present Illness:  Tom MaduroRobert came few weeks earlier than his is scheduled appointment.  He is very excited because he is going to [place] for 6 weeks with his wife provocation.  He is leaving and first week of May and will return in mid June.  He had to plan to see multiple places and is very excited about it.  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  He admitted lately very tired because of packing but denies any irritability, agitation, anger, mood swing.  He denies any mania or psychosis.  His energy level is okay.  He denies any rash or itching.  He likes Lamictal.  He denies any aggressive behavior or any feeling of hopelessness or worthlessness.  Patient lives with his wife is very supportive.  Patient denies drinking alcohol or using any illegal substances.  His appetite is okay.  His vitals are stable. He has no children.  Suicidal Ideation: No Plan Formed: No Patient has means to carry out plan: No  Homicidal Ideation: No Plan Formed: No Patient has means to carry out plan: No  Past Psychiatric History/Hospitalization(s) Patient has bipolar disorder since age 55.  He has at least one brief psychiatric hospitalization in [place] for 72 hours when he was very agitated aggressive and violent.  In the past he had tried Zoloft, Prozac and lithium with good response until it stopped working.  He had also tried Depakote, Abilify which did not help him.  He was also given Vistaril and Ativan for anxiety however he stopped taking it.  Patient had history of severe mood swings, impulsive behavior, manic symptoms which includes excessive shopping, spending, delusions, grandiosity and hyperactivity.  He had history of punching in the walls when he is very aggressive involvement.  He has a history  of legal issues in the past.  He was charged for disorderly conduct and DWI. Patient denies any history of suicidal attempt or any paranoia. Anxiety: Yes Bipolar Disorder: Yes Depression: Yes Mania: Yes Psychosis: Yes Schizophrenia: No Personality Disorder: No Hospitalization for psychiatric illness: Yes History of Electroconvulsive Shock Therapy: No Prior Suicide Attempts: No  Medical History; Patient has no active medical problems.  He has seen in the past Dr. Lula Olszew[place]Brett [place] in [place] but now looking for a new primary care physician. He denies any history of seizures.  Review of Systems: Psychiatric: Agitation: No Hallucination: No Depressed Mood: No Insomnia: No Hypersomnia: No Altered Concentration: No Feels Worthless: No Grandiose Ideas: No Belief In Special Powers: No New/Increased Substance Abuse: No Compulsions: No  Neurologic: Headache: No Seizure: No Paresthesias: No   Musculoskeletal: Strength & Muscle Tone: within normal limits Gait & Station: normal Patient leans: N/A   Outpatient Encounter Prescriptions as of 02/15/2016  Medication Sig  . atorvastatin (LIPITOR) 10 MG tablet 10 mg.  . lamoTRIgine (LAMICTAL) 200 MG tablet Take 2 tablets (400 mg total) by mouth daily.  . naproxen (NAPROSYN) 250 MG tablet Take by mouth as needed.  . [DISCONTINUED] lamoTRIgine (LAMICTAL) 200 MG tablet Take 2 tablets (400 mg total) by mouth daily.   No facility-administered encounter medications on file as of 02/15/2016.    No results found for this or any previous visit (from the past 2160 hour(s)).    Constitutional:  BP 130/78 mmHg  Pulse 68  Ht 6' (1.829 m)  Wt 181 lb 12.8 oz (82.464 kg)  BMI 24.65 kg/m2  Psychiatric Specialty Exam: Physical Exam  Review of Systems  Musculoskeletal: Negative.   Skin: Negative for itching and rash.  Neurological: Negative for dizziness and tremors.    Blood pressure 130/78, pulse 68, height 6' (1.829 m), weight 181 lb  12.8 oz (82.464 kg).Body mass index is 24.65 kg/(m^2).  General Appearance: Casual  Eye Contact::  Good  Speech:  Normal Rate  Volume:  Normal  Mood:  Euthymic  Affect:  Appropriate  Thought Process:  Coherent  Orientation:  Full (Time, Place, and Person)  Thought Content:  WDL  Suicidal Thoughts:  No  Homicidal Thoughts:  No  Memory:  Immediate;   Fair Recent;   Fair Remote;   Good  Judgement:  Good  Insight:  Good  Psychomotor Activity:  Normal  Concentration:  Good  Recall:  Good  Fund of Knowledge:  Good  Language:  Good  Akathisia:  No  Handed:  Right  AIMS (if indicated):     Assets:  Communication Skills Desire for Improvement Financial Resources/Insurance Housing Physical Health Social Support  ADL's:  Intact  Cognition:  WNL  Sleep:      Established Problem, Stable/Improving (1), Review or order clinical lab tests (1), Review of Last Therapy Session (1) and Review of Medication Regimen & Side Effects (2)  Assessment: Axis I: Bipolar disorder, type I.  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 Bipolar disorder (HCC)     Plan:  Patient is doing better on his current medication.  He is excited about his trip to [place].  I will continue Lamictal 200 mg daily.  He has no rash itching or any side effects. Discussed medication side effects and benefits. Recommended to call us back if he has any question or any concern.  Follow-up in 3 months.  Latifa Noble T., MD 02/15/2016

## 2016-05-16 ENCOUNTER — Ambulatory Visit (HOSPITAL_COMMUNITY): Payer: Self-pay | Admitting: Psychiatry

## 2016-06-26 ENCOUNTER — Other Ambulatory Visit (HOSPITAL_COMMUNITY): Payer: Self-pay | Admitting: Psychiatry

## 2016-06-26 DIAGNOSIS — F310 Bipolar disorder, current episode hypomanic: Secondary | ICD-10-CM

## 2016-07-17 ENCOUNTER — Ambulatory Visit (HOSPITAL_COMMUNITY): Payer: Self-pay | Admitting: Psychiatry

## 2016-08-06 ENCOUNTER — Other Ambulatory Visit (HOSPITAL_COMMUNITY): Payer: Self-pay | Admitting: Psychiatry

## 2016-08-06 DIAGNOSIS — F310 Bipolar disorder, current episode hypomanic: Secondary | ICD-10-CM

## 2016-08-07 NOTE — Telephone Encounter (Signed)
Met with Dr. Lovena Le, helping to cover for Dr. Adele Schilder out this date who approved a one time refill of patient's prescribed Lamictal 200 mg, 2 tablets by mouth daily, #60 with on refills once confirmed with [facility] this is the dosage patient has been taking for over one year and was last ordered..  [facility] order [facility] [address] as ordered and approved by Dr. Lovena Le for Lamictal 213m, 2 by mouth daily, #60 with no refills.  Patient to keep next appointment 09/11/16.

## 2016-09-11 ENCOUNTER — Encounter (HOSPITAL_COMMUNITY): Payer: Self-pay | Admitting: Psychiatry

## 2016-09-11 ENCOUNTER — Ambulatory Visit (INDEPENDENT_AMBULATORY_CARE_PROVIDER_SITE_OTHER): Payer: BC Managed Care – PPO | Admitting: Psychiatry

## 2016-09-11 DIAGNOSIS — F310 Bipolar disorder, current episode hypomanic: Secondary | ICD-10-CM | POA: Diagnosis not present

## 2016-09-11 DIAGNOSIS — Z79899 Other long term (current) drug therapy: Secondary | ICD-10-CM

## 2016-09-11 DIAGNOSIS — F1021 Alcohol dependence, in remission: Secondary | ICD-10-CM | POA: Diagnosis not present

## 2016-09-11 MED ORDER — LAMOTRIGINE 200 MG PO TABS: ORAL_TABLET | ORAL | 2 refills | Status: DC

## 2016-09-11 NOTE — Progress Notes (Signed)
[facility] [id number] Progress Note  Tom GlassRobert [name] [id number] 55 y.o.  09/11/2016 2:26 PM  Chief Complaint:  I have a wonderful trip to [place].     History of Present Illness:  Tom MaduroRobert came for his follow-up appointment.  He was last seen in April.  He's been taking his medication as prescribed and reported no side effects.  He denies any irritability, anger, mania or psychosis.  In summer he went to [place] and [place] he had a good time.  He like so much that he was thinking about moving to [place] but he does not speak [place] .  He denies any rash, itching or any headaches.  His energy level is good.  He continues to enjoy outdoor activities and now he is biking almost every day with his wife.  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  His appetite is okay.  His energy level is good.  Patient denies taking alcohol or using any illegal substances.  He likes to continue his Lamictal.  He is scheduled to see his primary care physician and coming weeks.  Patient lives with his wife .  He has no children.l   Suicidal Ideation: No Plan Formed: No Patient has means to carry out plan: No  Homicidal Ideation: No Plan Formed: No Patient has means to carry out plan: No  Past Psychiatric History/Hospitalization(s) Patient has bipolar disorder since age 55.  He has at least one brief psychiatric hospitalization in [place] for 72 hours when he was very agitated aggressive and violent.  In the past he had tried Zoloft, Prozac and lithium with good response until it stopped working.  He had also tried Depakote, Abilify which did not help him.  He was also given Vistaril and Ativan for anxiety however he stopped taking it.  Patient had history of severe mood swings, impulsive behavior, manic symptoms which includes excessive shopping, spending, delusions, grandiosity and hyperactivity.  He had history of punching in the walls when he is very aggressive involvement.  He has a  history of legal issues in the past.  He was charged for disorderly conduct and DWI. Patient denies any history of suicidal attempt or any paranoia. Anxiety: Yes Bipolar Disorder: Yes Depression: Yes Mania: Yes Psychosis: Yes Schizophrenia: No Personality Disorder: No Hospitalization for psychiatric illness: Yes History of Electroconvulsive Shock Therapy: No Prior Suicide Attempts: No  Medical History; Patient has no active medical problems.  He has seen in the past Dr. Lula OlszewskiBrett Burnett in [place] but now looking for a new primary care physician. He denies any history of seizures.  Review of Systems: Psychiatric: Agitation: No Hallucination: No Depressed Mood: No Insomnia: No Hypersomnia: No Altered Concentration: No Feels Worthless: No Grandiose Ideas: No Belief In Special Powers: No New/Increased Substance Abuse: No Compulsions: No  Neurologic: Headache: No Seizure: No Paresthesias: No   Musculoskeletal: Strength & Muscle Tone: within normal limits Gait & Station: normal Patient leans: N/A   Outpatient Encounter Prescriptions as of 09/11/2016  Medication Sig  . atorvastatin (LIPITOR) 10 MG tablet 10 mg.  . lamoTRIgine (LAMICTAL) 200 MG tablet TAKE 2 TABLETS(400 MG) BY MOUTH DAILY  . naproxen (NAPROSYN) 250 MG tablet Take by mouth as needed.   No facility-administered encounter medications on file as of 09/11/2016.     No results found for this or any previous visit (from the past 2160 hour(s)).    Constitutional:  BP 118/72   Pulse 76   Ht 6' (1.829 m)  Wt 180 lb (81.6 kg)   BMI 24.41 kg/m   Psychiatric Specialty Exam: Physical Exam  Review of Systems  Constitutional: Negative.   HENT: Negative.   Eyes: Negative.   Cardiovascular: Negative.   Musculoskeletal: Negative.   Skin: Negative.  Negative for itching and rash.    Blood pressure 118/72, pulse 76, height 6' (1.829 m), weight 180 lb (81.6 kg).Body mass index is 24.41 kg/m.  General  Appearance: Fairly Groomed  Patent attorneyye Contact::  Good  Speech:  Clear and Coherent and Normal Rate  Volume:  Normal  Mood:  Euthymic  Affect:  Appropriate and Congruent  Thought Process:  Goal Directed  Orientation:  Full (Time, Place, and Person)  Thought Content:  WDL and Logical  Suicidal Thoughts:  No  Homicidal Thoughts:  No  Memory:  Immediate;   Good Recent;   Good Remote;   Good  Judgement:  Good  Insight:  Good  Psychomotor Activity:  Normal  Concentration:  Good  Recall:  Good  Fund of Knowledge:  Good  Language:  Good  Akathisia:  No  Handed:  Right  AIMS (if indicated):     Assets:  Communication Skills Desire for Improvement Financial Resources/Insurance Housing Physical Health Social Support  ADL's:  Intact  Cognition:  WNL  Sleep:      Established Problem, Stable/Improving (1), Review of Last Therapy Session (1) and Review of Medication Regimen & Side Effects (2)  Assessment: Axis I: Bipolar disorder, type I.  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 Bipolar disorder (HCC)     Plan:  Patient is doing better on his current psychiatric medication.  He has no side effects.  Lactic continue Lamictal 200 mg 2 tablet daily.  He has no rash, itching or any side effects.  He scheduled to see his primary care physician in few weeks.  I recommended to have his blood work faxed to us.  Follow-up in 3 months Discussed medication side effects and benefits.  Recommended to call us back if there is any question, concern or worsening of the symptoms.  Discuss safety plan that anytime having active suicidal thoughts or homicidal thoughts and she need to call 911 or go to the local emergency room. [name] T., MD 09/11/2016

## 2016-12-12 ENCOUNTER — Ambulatory Visit (INDEPENDENT_AMBULATORY_CARE_PROVIDER_SITE_OTHER): Payer: BC Managed Care – PPO | Admitting: Psychiatry

## 2016-12-12 ENCOUNTER — Encounter (HOSPITAL_COMMUNITY): Payer: Self-pay | Admitting: Psychiatry

## 2016-12-12 DIAGNOSIS — Z818 Family history of other mental and behavioral disorders: Secondary | ICD-10-CM | POA: Diagnosis not present

## 2016-12-12 DIAGNOSIS — Z9889 Other specified postprocedural states: Secondary | ICD-10-CM | POA: Diagnosis not present

## 2016-12-12 DIAGNOSIS — Z811 Family history of alcohol abuse and dependence: Secondary | ICD-10-CM | POA: Diagnosis not present

## 2016-12-12 DIAGNOSIS — Z79899 Other long term (current) drug therapy: Secondary | ICD-10-CM

## 2016-12-12 DIAGNOSIS — Z813 Family history of other psychoactive substance abuse and dependence: Secondary | ICD-10-CM

## 2016-12-12 DIAGNOSIS — F310 Bipolar disorder, current episode hypomanic: Secondary | ICD-10-CM | POA: Diagnosis not present

## 2016-12-12 MED ORDER — LAMOTRIGINE 200 MG PO TABS: ORAL_TABLET | ORAL | 2 refills | Status: DC

## 2016-12-12 NOTE — Progress Notes (Signed)
BH MD/PA/NP OP Progress Note  12/12/2016 8:49 AM Tom GlassRobert [name]  MRN:  [id number]  Chief Complaint:  Subjective:  I'm stressed lately.  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  HPI: Tom MaduroRobert came for his follow-up appointment.  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  He is frustrated with the administration and the school system but hoping to start pretty soon.  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  He admitted more irritable and frustrated but denies any mania, hallucination, paranoia or any aggressive behavior.  He is sleeping 8 hours.  He is taking his medication and denies any side effects.  He does not want to add or change his medication.  He is pretty confident that once he start working he would be fine.  His wife is not tired but still works part-time with the school system.  He is excited about upcoming trip to [place] and suicidal and in May.  He started getting online classes about [place].  He denies any tremors, shakes, rash or any itching.  Recently he [facility] his primary care [name] and he had blood work.  He was told everything is normal.  Patient denies drinking alcohol or using any illegal substances.  He lives with his wife who is very supportive.  He has no children.  His appetite is okay.  His vital signs are stable.  Visit Diagnosis:    ICD-9-CM ICD-10-CM   1. Bipolar disorder, most recent episode hypomanic (HCC) 296.40 F31.0 lamoTRIgine (LAMICTAL) 200 MG tablet    Past Psychiatric History: Reviewed.  Past Medical History:  Past Medical History:  Diagnosis Date  . Bipolar disorder [facility](HCC)     Past Surgical History:  Procedure Laterality Date  . HERNIA REPAIR       Family Psychiatric History: Reviewed. Patient has bipolar disorder since age 56.  He has at least one brief psychiatric hospitalization in [place] for 72 hours when he was very agitated aggressive and violent.  In the past he had tried Zoloft, Prozac and lithium with good response until it stopped working.  He had also tried Depakote, Abilify which did not help him.  He was also given Vistaril and Ativan for anxiety however he stopped taking it.  Patient had history of severe mood swings, impulsive behavior, manic symptoms which includes excessive shopping, spending, delusions, grandiosity and hyperactivity.  He had history of punching in the walls. He has a history of legal issues in the past.  He was charged for disorderly conduct and DWI. Patient denies any history of suicidal attempt or any paranoia.  Family History:  Family History  Problem Relation Age of Onset  . Bipolar disorder Father   . Alcohol abuse Father   . Depression Mother   . Bipolar disorder Brother   . Depression Brother   . Drug abuse Brother   . Depression Brother     Social History:  Social History   Social History  . Marital status: Married    Spouse name: N/A  . Number of children: N/A  . Years of education: N/A   Social History Main Topics  . Smoking status: Never Smoker  . Smokeless tobacco: Never  Used  . Alcohol use 0.6 oz/week    1 Glasses of wine per week  . Drug use: No  . Sexual activity: Not Currently    Birth control/ protection: None   Other Topics Concern  . Not on file   Social History Narrative  . No narrative on file    Allergies: No Known Allergies  Metabolic Disorder Labs: Lab Results  Component Value Date   HGBA1C 5.1 08/05/2014   MPG 100 08/05/2014   No results found for: PROLACTIN Lab Results  Component Value Date   CHOL 222 (H) 08/05/2014   TRIG 151 (H) 08/05/2014   HDL 45 08/05/2014   CHOLHDL 4.9 08/05/2014   VLDL 30 08/05/2014   LDLCALC 147 (H) 08/05/2014      Current Medications: Current Outpatient Prescriptions  Medication Sig Dispense Refill  . atorvastatin (LIPITOR) 10 MG tablet 10 mg.  6  . lamoTRIgine (LAMICTAL) 200 MG tablet TAKE 2 TABLETS(400 MG) BY MOUTH DAILY 60 tablet 2  . naproxen (NAPROSYN) 250 MG tablet Take by mouth as needed.     No current facility-administered medications for this visit.     Neurologic: Headache: No Seizure: No Paresthesias: No  Musculoskeletal: Strength & Muscle Tone: within normal limits Gait & Station: normal Patient leans: N/A  Psychiatric Specialty Exam: Review of Systems  Constitutional: Negative.   HENT: Negative.   Musculoskeletal: Negative.   Skin: Negative.  Negative for itching and rash.  Neurological: Negative.     There were no vitals taken for this visit.There is no height or weight on file to calculate BMI.  General Appearance: Fairly Groomed  Eye Contact:  Good  Speech:  Clear and Coherent and Fast  Volume:  Normal  Mood:  Anxious  Affect:  Appropriate and Congruent  Thought Process:  Goal Directed  Orientation:  Full (Time, Place, and Person)  Thought Content: WDL and Logical   Suicidal Thoughts:  No  Homicidal Thoughts:  No  Memory:  Immediate;   Good Recent;   Good Remote;   Good  Judgement:  Good  Insight:  Good  Psychomotor Activity:  Normal  Concentration:  Concentration: Fair and Attention Span: Fair  Recall:  Good  Fund of Knowledge: Good  Language: Good  Akathisia:  No  Handed:  Right  AIMS (if indicated):    Assets:  Communication Skills Desire for Improvement Financial Resources/Insurance Housing Leisure Time Physical Health Resilience Social Support Talents/Skills Transportation  ADL's:  Intact  Cognition: WNL  Sleep:  Good     Assessment: Bipolar disorder type I.  Plan: Discuss recent irritability and frustration but patient does not want to add or change his medication.  He is confident that it is job related and once he start to  work he would be fine.  He like to continue Lamictal 400 mg daily.  He has no rash itching or any side effects.  Will contact his primary care physician for blood work which was done recently.  Follow-up in 3 months.Discussed medication side effects and benefits.  Recommended to call us back if there is any question, concern or worsening of the symptoms.  Discuss safety plan that anytime having active suicidal thoughts or homicidal thoughts and she need to call 911 or go to the local emergency room.  Marielis Samara T., MD 12/12/2016, 8:49 AM

## 2017-03-11 ENCOUNTER — Ambulatory Visit (INDEPENDENT_AMBULATORY_CARE_PROVIDER_SITE_OTHER): Payer: BC Managed Care – PPO | Admitting: Psychiatry

## 2017-03-11 ENCOUNTER — Encounter (HOSPITAL_COMMUNITY): Payer: Self-pay | Admitting: Psychiatry

## 2017-03-11 DIAGNOSIS — Z811 Family history of alcohol abuse and dependence: Secondary | ICD-10-CM | POA: Diagnosis not present

## 2017-03-11 DIAGNOSIS — Z813 Family history of other psychoactive substance abuse and dependence: Secondary | ICD-10-CM

## 2017-03-11 DIAGNOSIS — Z818 Family history of other mental and behavioral disorders: Secondary | ICD-10-CM

## 2017-03-11 DIAGNOSIS — F310 Bipolar disorder, current episode hypomanic: Secondary | ICD-10-CM | POA: Diagnosis not present

## 2017-03-11 MED ORDER — LAMOTRIGINE 200 MG PO TABS: ORAL_TABLET | ORAL | 2 refills | Status: DC

## 2017-03-11 NOTE — Progress Notes (Signed)
BH MD/PA/NP OP Progress Note  03/11/2017 10:44 AM Tom [name]  MRN:  [id number]  Chief Complaint:  Chief Complaint    Follow-up     Subjective:  I'm doing good.  I'm excited as getting [place] for 1 month with my wife.  HPI: Patient came for his follow-up appointment.  He is taking his medication as prescribed.  He denies any irritability, anger, mania or any psychosis.  He recently finished working for [employer].  He really enjoyed working with [employer].  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  Patient is excited as he is going with his wife Scarlette Calico tomorrow for 1 month.  Patient denies any mania or any suicidal thoughts.  He has no rash, itching, tremors or any shakes.  His energy level is good.  His appetite is okay.  Patient denies drinking alcohol or using any illegal substances.  His lives with his wife is very supportive.  He has no children.  His vital signs are stable.  Visit Diagnosis:    ICD-9-CM ICD-10-CM   1. Bipolar disorder, most recent episode hypomanic (HCC) 296.40 F31.0 lamoTRIgine (LAMICTAL) 200 MG tablet    Past Psychiatric History: Reviewed. Patient has bipolar disorder since age 56. He has at least one brief psychiatric hospitalization in [place] for 72 hours when he was very agitated aggressive and violent. In the past he had tried Zoloft, Prozac and lithium with good response until it stopped working. He had also tried Depakote, Abilify which did not help him. He was also given Vistaril and Ativan for anxiety however he stopped taking it. Patient had history of severe mood swings, impulsive behavior, manic symptoms which includes excessive shopping, spending, delusions, grandiosity and hyperactivity. He had history of punching in the walls. He has a history of legal issues in the past. He was charged for disorderly conduct and DWI. Patient denies any history of suicidal attempt or any paranoia.  Past Medical History:  Past Medical History:   Diagnosis Date  . Bipolar disorder [facility])     Past Surgical History:  Procedure Laterality Date  . HERNIA REPAIR      Family Psychiatric History: Reviewed.  Family History:  Family History  Problem Relation Age of Onset  . Bipolar disorder Father   . Alcohol abuse Father   . Depression Mother   . Bipolar disorder Brother   . Depression Brother   . Drug abuse Brother   . Depression Brother     Social History:  Social History   Social History  . Marital status: Married    Spouse name: N/A  . Number of children: N/A  . Years of education: N/A   Social History Main Topics  . Smoking status: Never Smoker  . Smokeless tobacco: Never Used  . Alcohol use 0.6 oz/week    1 Glasses of wine per week  . Drug use: No  . Sexual activity: Not Currently    Birth control/ protection: None   Other Topics Concern  . None   Social History Narrative  . None    Allergies: No Known Allergies  Metabolic Disorder Labs: Lab Results  Component Value Date   HGBA1C 5.1 08/05/2014   MPG 100 08/05/2014   No results found for: PROLACTIN Lab Results  Component Value Date   CHOL 222 (H) 08/05/2014   TRIG 151 (H) 08/05/2014   HDL 45 08/05/2014   CHOLHDL 4.9 08/05/2014   VLDL 30 08/05/2014   LDLCALC 147 (H) 08/05/2014  Current Medications: Current Outpatient Prescriptions  Medication Sig Dispense Refill  . atorvastatin (LIPITOR) 10 MG tablet 10 mg.  6  . lamoTRIgine (LAMICTAL) 200 MG tablet TAKE 2 TABLETS(400 MG) BY MOUTH DAILY 60 tablet 2  . naproxen (NAPROSYN) 250 MG tablet Take by mouth as needed.    . finasteride (PROSCAR) 5 MG tablet      No current facility-administered medications for this visit.     Neurologic: Headache: No Seizure: No Paresthesias: No  Musculoskeletal: Strength & Muscle Tone: within normal limits Gait & Station: normal Patient leans: N/A  Psychiatric Specialty Exam: Review of Systems  Constitutional: Negative.   HENT: Negative.    Musculoskeletal: Negative.   Skin: Negative.  Negative for itching and rash.  Neurological: Negative.     Blood pressure 122/72, pulse (!) 56, height 6' (1.829 m), weight 184 lb (83.5 kg), SpO2 98 %.Body mass index is 24.95 kg/m.  General Appearance: Casual  Eye Contact:  Good  Speech:  Clear and Coherent  Volume:  Normal  Mood:  Euthymic  Affect:  Congruent  Thought Process:  Goal Directed  Orientation:  Full (Time, Place, and Person)  Thought Content: WDL and Logical   Suicidal Thoughts:  No  Homicidal Thoughts:  No  Memory:  Immediate;   Good Recent;   Good Remote;   Good  Judgement:  Good  Insight:  Good  Psychomotor Activity:  Normal  Concentration:  Concentration: Good and Attention Span: Good  Recall:  Good  Fund of Knowledge: Good  Language: Good  Akathisia:  No  Handed:  Right  AIMS (if indicated):  0  Assets:  Communication Skills Desire for Improvement Housing Physical Health Resilience Social Support Transportation  ADL's:  Intact  Cognition: WNL  Sleep:  Good     Assessment: Bipolar disorder type I.  Plan: Patient is a stable on his current medication.  He has no rash or itching.  I will continue Lamictal 400 mg daily.  Discussed medication side effects and benefits.  Recommended to call us back if he has any question, concern or if he feel worsening of the symptom.  Follow-up in 3 months.  Elliot Meldrum T., MD 03/11/2017, 10:44 AM

## 2017-06-10 ENCOUNTER — Ambulatory Visit (INDEPENDENT_AMBULATORY_CARE_PROVIDER_SITE_OTHER): Payer: BC Managed Care – PPO | Admitting: Psychiatry

## 2017-06-10 ENCOUNTER — Encounter (HOSPITAL_COMMUNITY): Payer: Self-pay | Admitting: Psychiatry

## 2017-06-10 DIAGNOSIS — Z818 Family history of other mental and behavioral disorders: Secondary | ICD-10-CM | POA: Diagnosis not present

## 2017-06-10 DIAGNOSIS — F310 Bipolar disorder, current episode hypomanic: Secondary | ICD-10-CM

## 2017-06-10 DIAGNOSIS — Z813 Family history of other psychoactive substance abuse and dependence: Secondary | ICD-10-CM

## 2017-06-10 DIAGNOSIS — Z811 Family history of alcohol abuse and dependence: Secondary | ICD-10-CM | POA: Diagnosis not present

## 2017-06-10 MED ORDER — LAMOTRIGINE 200 MG PO TABS: ORAL_TABLET | ORAL | 2 refills | Status: DC

## 2017-06-10 NOTE — Progress Notes (Signed)
BH MD/PA/NP OP Progress Note  06/10/2017 8:32 AM Tom [name]  MRN:  [id number]  Chief Complaint:  Subjective:  I am doing good.  HPI: Patient came for his follow-up appointment.  He is taking Lamictal 400 mg in the morning.  He denies any rash or any itching.  He had a good trip to [place] where he stayed one month.  He is hoping to go back to [place] in November again with her life.  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  He sleeping good.  He denies any irritability, anger, mania, psychosis or any grandiosity.  His mood is stable.  His energy level is good.  His appetite is okay.  Patient denies drinking alcohol or using any illegal substances.  He like to continue current dose of Lamictal.  He is scheduled to see his primary care physician Dr. Nickolas Madrid in January for physical and blood work.  He lives with his wife is supportive.  He has no children.  His appetite is okay.  His vital signs are stable.  Visit Diagnosis:    ICD-10-CM   1. Bipolar disorder, most recent episode hypomanic (HCC) F31.0 lamoTRIgine (LAMICTAL) 200 MG tablet    Past Psychiatric History: Reviewed. Patient has history of bipolar disorder.  He had one brief psychiatric hospitalization for 72 hours in [place] where he was very agitated, aggressive and violent.  In the past he had tried Zoloft, Prozac and lithium with good response.  He also tried Depakote, Abilify which did not help him.  He was also given Vistaril and Ativan for anxiety but he stopped taking it.  Patient has history of severe mood swing, impulsive behavior, manic symptoms with excessive shopping buying and spending, delusions, grandiosity and hyperactivity.  Her history of punching in the walls.  He had a history of legal issues and charged for disorderly conduct and DWI.  Patient denies any history of suicidal attempt or any paranoia.  Past Medical History:   Past Medical History:  Diagnosis Date  . Bipolar disorder [facility])     Past Surgical History:  Procedure Laterality Date  . HERNIA REPAIR      Family Psychiatric History: Reviewed.  Family History:  Family History  Problem Relation Age of Onset  . Bipolar disorder Father   . Alcohol abuse Father   . Depression Mother   . Bipolar disorder Brother   . Depression Brother   . Drug abuse Brother   . Depression Brother     Social History:  Social History   Social History  . Marital status: Married    Spouse name: N/A  . Number of children: N/A  . Years of education: N/A   Social History Main Topics  . Smoking status: Never Smoker  . Smokeless tobacco: Never Used  . Alcohol use 0.6 oz/week    1 Glasses of wine per week  . Drug use: No  . Sexual activity: Not Currently    Birth control/ protection: None   Other Topics Concern  . None   Social History Narrative  . None    Allergies: No Known Allergies  Metabolic Disorder Labs: Lab Results  Component Value Date   HGBA1C 5.1 08/05/2014   MPG 100 08/05/2014   No results found for: PROLACTIN Lab Results  Component Value Date   CHOL 222 (H) 08/05/2014   TRIG 151 (H) 08/05/2014   HDL 45  08/05/2014   CHOLHDL 4.9 08/05/2014   VLDL 30 08/05/2014   LDLCALC 147 (H) 08/05/2014     Current Medications: Current Outpatient Prescriptions  Medication Sig Dispense Refill  . atorvastatin (LIPITOR) 10 MG tablet 10 mg.  6  . finasteride (PROSCAR) 5 MG tablet     . lamoTRIgine (LAMICTAL) 200 MG tablet TAKE 2 TABLETS(400 MG) BY MOUTH DAILY 60 tablet 2  . naproxen (NAPROSYN) 250 MG tablet Take by mouth as needed.     No current facility-administered medications for this visit.     Neurologic: Headache: No Seizure: No Paresthesias: No   Musculoskeletal: Strength & Muscle Tone: within normal limits Gait & Station: normal Patient leans: N/A  Psychiatric Specialty Exam: ROS  Blood pressure 128/72, pulse (!) 56,  height 6' (1.829 m), weight 189 lb 3.2 oz (85.8 kg).Body mass index is 25.66 kg/m.  General Appearance: Casual  Eye Contact:  Good  Speech:  Clear and Coherent  Volume:  Normal  Mood:  Euthymic  Affect:  Appropriate  Thought Process:  Goal Directed  Orientation:  Full (Time, Place, and Person)  Thought Content: Logical   Suicidal Thoughts:  No  Homicidal Thoughts:  No  Memory:  Immediate;   Good Recent;   Good Remote;   Good  Judgement:  Good  Insight:  Good  Psychomotor Activity:  Normal  Concentration:  Concentration: Good and Attention Span: Good  Recall:  Good  Fund of Knowledge: Good  Language: Good  Akathisia:  No  Handed:  Right  AIMS (if indicated):  0  Assets:  Communication Skills Desire for Improvement Housing Resilience Social Support  ADL's:  Intact  Cognition: WNL  Sleep:  ok    Assessment: Bipolar disorder type I.  Plan: Patient is a stable on his current medication.  He will continue Lamictal 400 mg in the morning.  He has no rash, itching, tremors or shakes.  Recommended to call us back if he has any question or any concern.  Follow-up in 3 months.  He is not interested in counseling.  Adream Parzych T., MD 06/10/2017, 8:32 AM

## 2017-06-11 ENCOUNTER — Ambulatory Visit (HOSPITAL_COMMUNITY): Payer: Self-pay | Admitting: Psychiatry

## 2017-09-10 ENCOUNTER — Ambulatory Visit (HOSPITAL_COMMUNITY): Payer: Self-pay | Admitting: Psychiatry

## 2017-09-30 ENCOUNTER — Ambulatory Visit (HOSPITAL_COMMUNITY): Payer: Self-pay | Admitting: Psychiatry

## 2017-10-09 ENCOUNTER — Ambulatory Visit (INDEPENDENT_AMBULATORY_CARE_PROVIDER_SITE_OTHER): Payer: BLUE CROSS/BLUE SHIELD | Admitting: Psychiatry

## 2017-10-09 ENCOUNTER — Encounter (HOSPITAL_COMMUNITY): Payer: Self-pay | Admitting: Psychiatry

## 2017-10-09 DIAGNOSIS — Z811 Family history of alcohol abuse and dependence: Secondary | ICD-10-CM | POA: Diagnosis not present

## 2017-10-09 DIAGNOSIS — Z79899 Other long term (current) drug therapy: Secondary | ICD-10-CM

## 2017-10-09 DIAGNOSIS — Z813 Family history of other psychoactive substance abuse and dependence: Secondary | ICD-10-CM

## 2017-10-09 DIAGNOSIS — Z818 Family history of other mental and behavioral disorders: Secondary | ICD-10-CM

## 2017-10-09 DIAGNOSIS — F310 Bipolar disorder, current episode hypomanic: Secondary | ICD-10-CM | POA: Diagnosis not present

## 2017-10-09 MED ORDER — LAMOTRIGINE 200 MG PO TABS: ORAL_TABLET | ORAL | 2 refills | Status: DC

## 2017-10-09 NOTE — Progress Notes (Signed)
BH MD/PA/NP OP Progress Note  10/09/2017 7:07 AM Tom GlassRobert [name]  MRN:  [id number]  Chief Complaint: We are moving to [place].  We are buying a house and hoping to move in few months.  HPI: Patient came for his follow-up appointment.  He is excited because they are moving to [place] and they even offered to buy a house in [place] which was accepted.  Patient is hoping that he and his wife can move in April.  At this time he is not selling his own house in [place] but like to put in a rent.  He describes his mood is very good.  He denies any irritability, anger, mania or any psychosis.  He really enjoy recent trip to [place].  He feels very energetic and he do a lot of biking and feel very relaxed.  His wife is retired from [employer].  He is hoping to spend rest of his life and friends.  However he like to continue travel every few months until he find a good physician there.  He denies drinking alcohol or using any illegal substances.  He denies any mania or any grandiosity.  He has no rash, itching, tremors or shakes.  He will see his primary care physician Dr. Eula ListenHussain in January for physical and blood work.  Patient lives with his wife who is very supportive.  He has no children.  Denies any feeling of hopelessness or worthlessness.  His energy level is good.  His weight is a stable.  Visit Diagnosis:    ICD-10-CM   1. Bipolar disorder, most recent episode hypomanic (HCC) F31.0 lamoTRIgine (LAMICTAL) 200 MG tablet    Past Psychiatric History: Viewed. Patient has history of bipolar disorder.  He had one brief psychiatric hospitalization for 72 hours in [place] where he was very agitated, aggressive and violent.  In the past he had tried Zoloft, Prozac and lithium with good response.  He also tried Depakote, Abilify which did not help him.  He was also given Vistaril and Ativan for anxiety but he stopped taking it.  Patient has history of severe mood swing, impulsive behavior, manic  symptoms with excessive shopping buying and spending, delusions, grandiosity and hyperactivity.  Her history of punching in the walls.  He had a history of legal issues and charged for disorderly conduct and DWI.  Patient denies any history of suicidal attempt or any paranoia.  Past Medical History:  Past Medical History:  Diagnosis Date  . Bipolar disorder [facility](HCC)     Past Surgical History:  Procedure Laterality Date  . HERNIA REPAIR      Family Psychiatric History: Reviewed.  Family History:  Family History  Problem Relation Age of Onset  . Bipolar disorder Father   . Alcohol abuse Father   . Depression Mother   . Bipolar disorder Brother   . Depression Brother   . Drug abuse Brother   . Depression Brother     Social History:  Social History   Socioeconomic History  . Marital status: Married    Spouse name: Not on file  . Number of children: Not on file  . Years of education: Not on file  . Highest education level: Not on file  Social Needs  . Financial resource strain: Not on file  . Food insecurity - worry: Not on file  . Food insecurity - inability: Not on file  . Transportation needs - medical: Not on file  . Transportation needs - non-medical: Not on  file  Occupational History  . Not on file  Tobacco Use  . Smoking status: Never Smoker  . Smokeless tobacco: Never Used  Substance and Sexual Activity  . Alcohol use: Yes    Alcohol/week: 0.6 oz    Types: 1 Glasses of wine per week  . Drug use: No  . Sexual activity: Not Currently    Birth control/protection: None  Other Topics Concern  . Not on file  Social History Narrative  . Not on file    Allergies: No Known Allergies  Metabolic Disorder Labs: Lab Results  Component Value Date   HGBA1C 5.1 08/05/2014   MPG 100 08/05/2014   No results found for: PROLACTIN Lab Results  Component Value Date   CHOL 222 (H) 08/05/2014   TRIG 151 (H) 08/05/2014   HDL 45 08/05/2014   CHOLHDL 4.9 08/05/2014    VLDL 30 08/05/2014   LDLCALC 147 (H) 08/05/2014   No results found for: TSH  Therapeutic Level Labs: No results found for: LITHIUM No results found for: VALPROATE No components found for:  CBMZ  Current Medications: Current Outpatient Medications  Medication Sig Dispense Refill  . atorvastatin (LIPITOR) 10 MG tablet 10 mg.  6  . finasteride (PROSCAR) 5 MG tablet     . lamoTRIgine (LAMICTAL) 200 MG tablet TAKE 2 TABLETS(400 MG) BY MOUTH DAILY 60 tablet 2  . naproxen (NAPROSYN) 250 MG tablet Take by mouth as needed.     No current facility-administered medications for this visit.      Musculoskeletal: Strength & Muscle Tone: within normal limits Gait & Station: normal Patient leans: N/A  Psychiatric Specialty Exam: Review of Systems  Constitutional: Negative for weight loss.  Skin: Negative.  Negative for itching and rash.  Neurological: Negative for tremors and headaches.    Weight 201 lb (91.2 kg).There is no height or weight on file to calculate BMI.  General Appearance: Casual  Eye Contact:  Good  Speech:  Clear and Coherent  Volume:  Normal  Mood:  Euthymic  Affect:  Appropriate  Thought Process:  Goal Directed  Orientation:  Full (Time, Place, and Person)  Thought Content: Logical   Suicidal Thoughts:  No  Homicidal Thoughts:  No  Memory:  Immediate;   Good Recent;   Good Remote;   Good  Judgement:  Good  Insight:  Good  Psychomotor Activity:  Normal  Concentration:  Concentration: Good and Attention Span: Good  Recall:  Good  Fund of Knowledge: Good  Language: Good  Akathisia:  No  Handed:  Right  AIMS (if indicated): not done  Assets:  Communication Skills Desire for Improvement Housing Resilience Social Support  ADL's:  Intact  Cognition: WNL  Sleep:  Good   Screenings:   Assessment and Plan: Bipolar disorder type I.  Patient doing much better on his current dose of Lamictal.  He is taking 400 mg daily.  He has no rash, itching, tremors  or shakes.  Discussed follow-up treatments while in the [place].  Patient is actively looking for a physician but is still wants to commute every few months until he find physician.  He gained a few pounds from the past.  Discussed healthy lifestyle continue to encourage watching his calorie intake and regular exercise.  Patient is scheduled to see his physician in January.  I recommended to have his blood work results faxed to us.  Recommended to call us back if he has any question or any concern.  Follow-up in 3 months.  Cleotis Nipper, MD 10/09/2017, 7:07 AM

## 2018-01-07 ENCOUNTER — Encounter (HOSPITAL_COMMUNITY): Payer: Self-pay | Admitting: Psychiatry

## 2018-01-07 ENCOUNTER — Ambulatory Visit (INDEPENDENT_AMBULATORY_CARE_PROVIDER_SITE_OTHER): Payer: BLUE CROSS/BLUE SHIELD | Admitting: Psychiatry

## 2018-01-07 DIAGNOSIS — Z818 Family history of other mental and behavioral disorders: Secondary | ICD-10-CM

## 2018-01-07 DIAGNOSIS — Z79899 Other long term (current) drug therapy: Secondary | ICD-10-CM | POA: Diagnosis not present

## 2018-01-07 DIAGNOSIS — Z813 Family history of other psychoactive substance abuse and dependence: Secondary | ICD-10-CM | POA: Diagnosis not present

## 2018-01-07 DIAGNOSIS — F310 Bipolar disorder, current episode hypomanic: Secondary | ICD-10-CM | POA: Diagnosis not present

## 2018-01-07 DIAGNOSIS — Z811 Family history of alcohol abuse and dependence: Secondary | ICD-10-CM

## 2018-01-07 MED ORDER — LAMOTRIGINE 200 MG PO TABS: ORAL_TABLET | ORAL | 2 refills | Status: DC

## 2018-01-07 NOTE — Progress Notes (Signed)
BH MD/PA/NP OP Progress Note  01/07/2018 8:11 AM Lorin GlassRobert Gikas  MRN:  [id number]  Chief Complaint: Plan to buy a house in [place] is delayed.  It may happen in mid August.  HPI: Patient came for his follow-up appointment.  He is compliant with his Lamictal.  He denies any irritability, anger, mania or any psychosis.  Patient told that they did not buy a house in [place] but now still looking for house to rent for at least a year.  He is sleeping good.  He is involved in gym and regular exercise.  He lost 10 pounds since the last visit.  He denies any mania or grandiosity.  He has no tremors, shakes, itching or any side effects.  Recently he seen his primary care physician Dr. Eula ListenHussain and he has blood work in January.  Patient was told everything is normal and no new medication added.  Patient energy level is good.  He denies any feeling of hopelessness or worthlessness.  Denies any suicidal thoughts or homicidal thought.  His appetite is okay.  He continues to engage daily biking but his wife.  Patient denies drinking alcohol or using any illegal substances.  Visit Diagnosis:    ICD-10-CM   1. Bipolar disorder, most recent episode hypomanic (HCC) F31.0 lamoTRIgine (LAMICTAL) 200 MG tablet    Past Psychiatric History: Reviewed. Patient has history of bipolar disorder. He had one brief psychiatric hospitalization for 72 hours in [place] where he was very agitated, aggressive and violent. In the past he had tried Zoloft, Prozac and lithium with good response. He also tried Depakote, Abilify which did not help him. He was also given Vistaril and Ativan for anxiety but he stopped taking it. Patient has history of severe mood swing, impulsive behavior, manic symptoms with excessive shopping buying and spending, delusions, grandiosity and hyperactivity. Her history of punching in the walls. He had a history of legal issues and charged for disorderly conduct and DWI. Patient denies any history of  suicidal attempt or any paranoia.  Past Medical History:  Past Medical History:  Diagnosis Date  . Bipolar disorder [facility](HCC)     Past Surgical History:  Procedure Laterality Date  . HERNIA REPAIR      Family Psychiatric History: Viewed.  Family History:  Family History  Problem Relation Age of Onset  . Bipolar disorder Father   . Alcohol abuse Father   . Depression Mother   . Bipolar disorder Brother   . Depression Brother   . Drug abuse Brother   . Depression Brother     Social History:  Social History   Socioeconomic History  . Marital status: Married    Spouse name: None  . Number of children: None  . Years of education: None  . Highest education level: None  Social Needs  . Financial resource strain: None  . Food insecurity - worry: None  . Food insecurity - inability: None  . Transportation needs - medical: None  . Transportation needs - non-medical: None  Occupational History  . None  Tobacco Use  . Smoking status: Never Smoker  . Smokeless tobacco: Never Used  Substance and Sexual Activity  . Alcohol use: Yes    Alcohol/week: 0.6 oz    Types: 1 Glasses of wine per week  . Drug use: No  . Sexual activity: Not Currently    Birth control/protection: None  Other Topics Concern  . None  Social History Narrative  . None    Allergies: No Known  Allergies  Metabolic Disorder Labs: Lab Results  Component Value Date   HGBA1C 5.1 08/05/2014   MPG 100 08/05/2014   No results found for: PROLACTIN Lab Results  Component Value Date   CHOL 222 (H) 08/05/2014   TRIG 151 (H) 08/05/2014   HDL 45 08/05/2014   CHOLHDL 4.9 08/05/2014   VLDL 30 08/05/2014   LDLCALC 147 (H) 08/05/2014   No results found for: TSH  Therapeutic Level Labs: No results found for: LITHIUM No results found for: VALPROATE No components found for:  CBMZ  Current Medications: Current Outpatient Medications  Medication Sig Dispense Refill  . atorvastatin (LIPITOR) 10 MG tablet  10 mg.  6  . finasteride (PROSCAR) 5 MG tablet     . lamoTRIgine (LAMICTAL) 200 MG tablet TAKE 2 TABLETS(400 MG) BY MOUTH DAILY 60 tablet 2  . naproxen (NAPROSYN) 250 MG tablet Take by mouth as needed.     No current facility-administered medications for this visit.      Musculoskeletal: Strength & Muscle Tone: within normal limits Gait & Station: normal Patient leans: N/A  Psychiatric Specialty Exam: ROS  Blood pressure 122/78, pulse (!) 59, height 6' (1.829 m), weight 189 lb (85.7 kg).Body mass index is 25.63 kg/m.  General Appearance: Casual  Eye Contact:  Good  Speech:  Clear and Coherent  Volume:  Normal  Mood:  Euthymic  Affect:  Congruent  Thought Process:  Goal Directed  Orientation:  Full (Time, Place, and Person)  Thought Content: Logical   Suicidal Thoughts:  No  Homicidal Thoughts:  No  Memory:  Immediate;   Good Recent;   Good Remote;   Good  Judgement:  Good  Insight:  Good  Psychomotor Activity:  Normal  Concentration:  Concentration: Good and Attention Span: Good  Recall:  Good  Fund of Knowledge: Good  Language: Good  Akathisia:  No  Handed:  Right  AIMS (if indicated): not done  Assets:  Communication Skills Desire for Improvement Housing Resilience  ADL's:  Intact  Cognition: WNL  Sleep:  Good   Screenings:   Assessment and Plan: Bipolar disorder type I.  Patient is a stable on Lamictal 400 mg daily.  He has no rash, itching tremors or shakes.  We will get his consent so we can contact his primary care physician for blood work which was done in January.  Patient may move to [place] with his wife in mid August.  Recommended to call us back if he has any question or any concern.  Recommended to continue healthy lifestyle and regular biking and watch his calorie intake.  Follow-up in 3 months.   Cleotis Nipper, MD 01/07/2018, 8:11 AM

## 2018-04-07 ENCOUNTER — Encounter (HOSPITAL_COMMUNITY): Payer: Self-pay | Admitting: Psychiatry

## 2018-04-07 ENCOUNTER — Ambulatory Visit (HOSPITAL_COMMUNITY): Payer: BLUE CROSS/BLUE SHIELD | Admitting: Psychiatry

## 2018-04-07 ENCOUNTER — Ambulatory Visit (INDEPENDENT_AMBULATORY_CARE_PROVIDER_SITE_OTHER): Payer: BLUE CROSS/BLUE SHIELD | Admitting: Psychiatry

## 2018-04-07 DIAGNOSIS — F310 Bipolar disorder, current episode hypomanic: Secondary | ICD-10-CM

## 2018-04-07 MED ORDER — LAMOTRIGINE 200 MG PO TABS: ORAL_TABLET | ORAL | 2 refills | Status: DC

## 2018-04-07 NOTE — Progress Notes (Addendum)
BH MD/PA/NP OP Progress Note  04/07/2018 8:12 AM Tom [name]  MRN:  [id number]  Chief Complaint: I am doing better.  I started working part-time job but I am not sure how long it was last.  HPI: Tom [name] came for his follow-up appointment.  He is taking Lamictal and denies no side effects.  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  He admitted sometimes he gets frustrated and irritable because there is no schedule and organization at the job.  He was also not happy when he was not paid on time.  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  He is not sure how long it would last because he is going to [place] in August for 10 weeks.  He is sleeping good.  He is doing biking a lot and he lost 15 pounds since the last visit.  He endorsed that moving to [place] is now on hold but is still like to spend 10 to 12 weeks in August before he make the final decision.  His energy level is good.  He denies any irritability, anger, impulsive behavior, mania, psychosis.  He denies any feeling of hopelessness or worthlessness.  He lives with his wife who is very supportive.  He denies drinking alcohol or using any illegal substances.    Visit Diagnosis: No diagnosis found.  Past Psychiatric History: Reviewed. Patient has history of bipolar disorder. He had one brief psychiatric hospitalization for 72 hours in [place] where he was very agitated, aggressive and violent. In the past he had tried Zoloft, Prozac and lithium with good response. He also tried Depakote, Abilify which did not help him. He was also given Vistaril and Ativan for anxiety but he stopped taking it. Patient has history of severe mood swing, impulsive behavior, manic symptoms with excessive shopping buying and spending, delusions, grandiosity and hyperactivity. Her history of punching in the walls. He had a history of legal issues and charged for disorderly conduct and DWI. Patient denies any  history of suicidal attempt or any paranoia.  Past Medical History:  Past Medical History:  Diagnosis Date  . Bipolar disorder [facility])     Past Surgical History:  Procedure Laterality Date  . HERNIA REPAIR      Family Psychiatric History: Reviewed  Family History:  Family History  Problem Relation Age of Onset  . Bipolar disorder Father   . Alcohol abuse Father   . Depression Mother   . Bipolar disorder Brother   . Depression Brother   . Drug abuse Brother   . Depression Brother     Social History:  Social History   Socioeconomic History  . Marital status: Married    Spouse name: Not on file  . Number of children: Not on file  . Years of education: Not on file  . Highest education level: Not on file  Occupational History  . Not on file  Social Needs  . Financial resource strain: Not on file  . Food insecurity:    Worry: Not on file    Inability: Not on file  . Transportation needs:    Medical: Not on file    Non-medical: Not on file  Tobacco Use  . Smoking status: Never Smoker  . Smokeless tobacco: Never Used  Substance and Sexual Activity  . Alcohol use: Yes    Alcohol/week: 0.6 oz    Types: 1 Glasses of wine per week  . Drug use:  No  . Sexual activity: Not Currently    Birth control/protection: None  Lifestyle  . Physical activity:    Days per week: Not on file    Minutes per session: Not on file  . Stress: Not on file  Relationships  . Social connections:    Talks on phone: Not on file    Gets together: Not on file    Attends religious service: Not on file    Active member of club or organization: Not on file    Attends meetings of clubs or organizations: Not on file    Relationship status: Not on file  Other Topics Concern  . Not on file  Social History Narrative  . Not on file    Allergies: No Known Allergies  Metabolic Disorder Labs: Lab Results  Component Value Date   HGBA1C 5.1 08/05/2014   MPG 100 08/05/2014   No results found  for: PROLACTIN Lab Results  Component Value Date   CHOL 222 (H) 08/05/2014   TRIG 151 (H) 08/05/2014   HDL 45 08/05/2014   CHOLHDL 4.9 08/05/2014   VLDL 30 08/05/2014   LDLCALC 147 (H) 08/05/2014   No results found for: TSH  Therapeutic Level Labs: No results found for: LITHIUM No results found for: VALPROATE No components found for:  CBMZ  Current Medications: Current Outpatient Medications  Medication Sig Dispense Refill  . atorvastatin (LIPITOR) 10 MG tablet 10 mg.  6  . finasteride (PROSCAR) 5 MG tablet     . lamoTRIgine (LAMICTAL) 200 MG tablet TAKE 2 TABLETS(400 MG) BY MOUTH DAILY 60 tablet 2  . naproxen (NAPROSYN) 250 MG tablet Take by mouth as needed.     No current facility-administered medications for this visit.      Musculoskeletal: Strength & Muscle Tone: within normal limits Gait & Station: normal Patient leans: N/A  Psychiatric Specialty Exam: Review of Systems  Constitutional: Positive for weight loss.  Skin: Negative.  Negative for itching and rash.  Neurological: Negative.     Blood pressure 126/74, pulse 66, height 6' (1.829 m), weight 175 lb (79.4 kg).Body mass index is 23.73 kg/m.  General Appearance: Casual  Eye Contact:  Good  Speech:  Clear and Coherent  Volume:  Normal  Mood:  Euthymic  Affect:  Congruent  Thought Process:  Goal Directed  Orientation:  Full (Time, Place, and Person)  Thought Content: Logical   Suicidal Thoughts:  No  Homicidal Thoughts:  No  Memory:  Immediate;   Good Recent;   Good Remote;   Good  Judgement:  Good  Insight:  Good  Psychomotor Activity:  Normal  Concentration:  Concentration: Good and Attention Span: Good  Recall:  Good  Fund of Knowledge: Good  Language: Good  Akathisia:  No  Handed:  Right  AIMS (if indicated): not done  Assets:  Communication Skills Desire for Improvement Housing Resilience Social Support  ADL's:  Intact  Cognition: WNL  Sleep:  Good   Screenings:   Assessment  and Plan: Bipolar disorder type I.    Patient doing better on Lamictal 400 mg daily.  He has no rash, itching, tremors, shakes or any EPS.  I reviewed blood work results from his primary care physician Dr. Haywood PaoHasan which was done in January.  His creatinine is 0.89, glucose 88 and BUN 13.  His liver function test Recommended to call us back if is any question or any concern.  Patient like to follow-up before he go to [place] in August.  Follow-up in 10 to 12 weeks.  Encourage healthy lifestyle and watch his calorie intake.   Cleotis Nipper, MD 04/07/2018, 8:12 AM

## 2018-06-03 ENCOUNTER — Ambulatory Visit (INDEPENDENT_AMBULATORY_CARE_PROVIDER_SITE_OTHER): Payer: BLUE CROSS/BLUE SHIELD | Admitting: Psychiatry

## 2018-06-03 ENCOUNTER — Encounter (HOSPITAL_COMMUNITY): Payer: Self-pay | Admitting: Psychiatry

## 2018-06-03 DIAGNOSIS — F310 Bipolar disorder, current episode hypomanic: Secondary | ICD-10-CM

## 2018-06-03 DIAGNOSIS — Z811 Family history of alcohol abuse and dependence: Secondary | ICD-10-CM

## 2018-06-03 DIAGNOSIS — Z818 Family history of other mental and behavioral disorders: Secondary | ICD-10-CM | POA: Diagnosis not present

## 2018-06-03 MED ORDER — LAMOTRIGINE 200 MG PO TABS: ORAL_TABLET | ORAL | 1 refills | Status: DC

## 2018-06-03 NOTE — Progress Notes (Addendum)
BH MD/PA/NP OP Progress Note  06/03/2018 12:25 PM Tom GlassRobert [name]  MRN:  [id number]  Chief Complaint: patient returns for medication management appointment HPI: I am covering for Dr. Lolly MustacheArfeen, who is out of the office. Patient is aware that he will continue to follow up with Dr. Lolly MustacheArfeen. 57 year old married male, no children.  History of Bipolar Disorder diagnosis. Reports long term management of this condition with Lamictal. States this medication has been effective, well tolerated . At this time denies depression or neuro-vegetative symptoms of depression. No anhedonia. Denies mania/hypomania symptoms and does not present with pressured speech, irritability , or any thought disorder. States he is sleeping well . Reports he feels Lamictal has been a very effective medication for him, without side effects. Denies medication side effects at this time. Patient presents euthymic, future oriented. States him and his wife spend long periods of time in [place], and will be travelling soon, spending 2-3  months  there. In this context, is hoping for a Lamictal refill x at least 2 months, in order to avoid running out while abroad. .   Visit Diagnosis:    ICD-10-CM   1. Bipolar disorder, most recent episode hypomanic (HCC) F31.0 lamoTRIgine (LAMICTAL) 200 MG tablet    Past Psychiatric History:   Past Medical History:  Past Medical History:  Diagnosis Date  . Bipolar disorder [facility](HCC)     Past Surgical History:  Procedure Laterality Date  . HERNIA REPAIR      Family Psychiatric History:   Family History:  Family History  Problem Relation Age of Onset  . Bipolar disorder Father   . Alcohol abuse Father   . Depression Mother   . Bipolar disorder Brother   . Depression Brother   . Drug abuse Brother   . Depression Brother     Social History:  Social History   Socioeconomic History  . Marital status: Married    Spouse name: Not on file  . Number of children: Not on file  . Years of  education: Not on file  . Highest education level: Not on file  Occupational History  . Not on file  Social Needs  . Financial resource strain: Not on file  . Food insecurity:    Worry: Not on file    Inability: Not on file  . Transportation needs:    Medical: Not on file    Non-medical: Not on file  Tobacco Use  . Smoking status: Never Smoker  . Smokeless tobacco: Never Used  Substance and Sexual Activity  . Alcohol use: Yes    Alcohol/week: 1.0 standard drinks    Types: 1 Glasses of wine per week  . Drug use: No  . Sexual activity: Not Currently    Birth control/protection: None  Lifestyle  . Physical activity:    Days per week: Not on file    Minutes per session: Not on file  . Stress: Not on file  Relationships  . Social connections:    Talks on phone: Not on file    Gets together: Not on file    Attends religious service: Not on file    Active member of club or organization: Not on file    Attends meetings of clubs or organizations: Not on file    Relationship status: Not on file  Other Topics Concern  . Not on file  Social History Narrative  . Not on file    Allergies: No Known Allergies  Metabolic Disorder Labs: Lab Results  Component Value Date   HGBA1C 5.1 08/05/2014   MPG 100 08/05/2014   No results found for: PROLACTIN Lab Results  Component Value Date   CHOL 222 (H) 08/05/2014   TRIG 151 (H) 08/05/2014   HDL 45 08/05/2014   CHOLHDL 4.9 08/05/2014   VLDL 30 08/05/2014   LDLCALC 147 (H) 08/05/2014   No results found for: TSH  Therapeutic Level Labs: No results found for: LITHIUM No results found for: VALPROATE No components found for:  CBMZ  Current Medications: Current Outpatient Medications  Medication Sig Dispense Refill  . atorvastatin (LIPITOR) 10 MG tablet 10 mg.  6  . finasteride (PROSCAR) 5 MG tablet     . lamoTRIgine (LAMICTAL) 200 MG tablet TAKE 2 TABLETS(400 MG) BY MOUTH DAILY 120 tablet 1  . naproxen (NAPROSYN) 250 MG  tablet Take by mouth as needed.     No current facility-administered medications for this visit.      Musculoskeletal: Strength & Muscle Tone: within normal limits Gait & Station: normal Patient leans: N/A  Psychiatric Specialty Exam: ROS no headache, no chest pain, no nausea, no vomiting, no fever, no rash   Blood pressure 136/80, pulse 72, height 6' (1.829 m), weight 81.2 kg.Body mass index is 24.28 kg/m.  General Appearance: Well Groomed  Eye Contact:  Good  Speech:  Normal Rate  Volume:  Normal  Mood:  reportsm mood is currently norma/stable, and presents euthymic  Affect:  Appropriate and Full Range  Thought Process:  Linear and Descriptions of Associations: Intact  Orientation:  Full (Time, Place, and Person)  Thought Content: no hallucinations, no delusions, not internally preoccupied    Suicidal Thoughts:  No denies suicidal or self injurious ideations, denies homicidal or violent ideations  Homicidal Thoughts:  No  Memory:  recent and remote grossly intact   Judgement:  Other:  present  Insight:  Present  Psychomotor Activity:  Normal- no psychomotor agitation or restlessness   Concentration:  Concentration: Good and Attention Span: Good  Recall:  Good  Fund of Knowledge: Good  Language: Good  Akathisia:  Negative  Handed:  Right  AIMS (if indicated):  AIMS test not done today  Assets:  Communication Skills Desire for Improvement Resilience  ADL's:  Intact  Cognition: WNL  Sleep:  Good   Screenings:   Assessment and Plan: 57 year old married male, history of Bipolar Disorder diagnosis. Currently stable, euthymic, without symptoms of depression or of hypomania/mania. Currently future oriented, and looking forward to a planned trip to [place] with his [place] . History of long term management with Lamictal, which has been well tolerated . Denies side effects . We have reviewed side effect profile, to include risk of severe rash/ S.J. Syndrome. Renew Lamictal 400  mgrs QDAY , x 2 months, one refill. Patient to return to clinic in 2-3 months, agrees to contact clinic , return sooner if any concern or worsening prior Will seek care in [place] , should there be any decompensation or acute issue while travelling .   Craige CottaFernando A Cobos, MD 06/03/2018, 12:25 PM

## 2018-09-03 ENCOUNTER — Ambulatory Visit (INDEPENDENT_AMBULATORY_CARE_PROVIDER_SITE_OTHER): Payer: BLUE CROSS/BLUE SHIELD | Admitting: Psychiatry

## 2018-09-03 ENCOUNTER — Encounter (HOSPITAL_COMMUNITY): Payer: Self-pay | Admitting: Psychiatry

## 2018-09-03 DIAGNOSIS — F310 Bipolar disorder, current episode hypomanic: Secondary | ICD-10-CM

## 2018-09-03 MED ORDER — LAMOTRIGINE 200 MG PO TABS: ORAL_TABLET | ORAL | 0 refills | Status: DC

## 2018-09-03 NOTE — Progress Notes (Signed)
BH MD/PA/NP OP Progress Note  09/03/2018 8:24 AM Tom [name]  MRN:  [id number]  Chief Complaint: We just came back from [place] after 10-week staying there.  We had a wonderful time.    HPI: Tom [name] came for his follow-up appointment.  He is taking Lamictal 400 mg daily.  Patient and his wife recently spent 10 weeks in [place] and they had a good time.  They are still not sure about their final move but they are going again in April to spend 8 to 10 weeks in [place].  Taking his medication as prescribed and denies any irritability, anger, mania or any psychosis.  His sleep is good.  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  Patient denies any hallucination or any suicidal thoughts.  Since taking the Lamictal he has no episodes of agitation, aggression or violent episode.  He has no concerns about the Lamictal.  He has no rash, itching or tremors.  His energy level is good.  He enjoys biking every day.  Patient denies drinking or using any illegal substances.   Visit Diagnosis:    ICD-10-CM   1. Bipolar disorder, most recent episode hypomanic (HCC) F31.0 lamoTRIgine (LAMICTAL) 200 MG tablet    Past Psychiatric History: Viewed. Patient has history of bipolar disorder. He had a brief psychiatric hospitalization for 72 hours in [place] where he was very agitated, aggressive and violent. In the past he had tried Zoloft, Prozac and lithium with good response. He also tried Depakote, Abilify which did not help him. He was also given Vistaril and Ativan for anxiety but he stopped taking it. Patient has history of severe mood swing, impulsive behavior, manic symptoms with excessive shopping, spending, delusions, grandiosity and hyperactivity. Her history of punching in the walls. He had a history of legal issues and charged for disorderly conduct and DWI.  Patient denies any history of suicidal attempt or any paranoia.  Past Medical History:  Past Medical History:  Diagnosis Date  . Bipolar disorder [facility](HCC)     Past Surgical History:  Procedure Laterality Date  . HERNIA REPAIR      Family Psychiatric History: Reviewed.  Family History:  Family History  Problem Relation Age of Onset  . Bipolar disorder Father   . Alcohol abuse Father   . Depression Mother   . Bipolar disorder Brother   . Depression Brother   . Drug abuse Brother   . Depression Brother     Social History:  Social History   Socioeconomic History  . Marital status: Married    Spouse name: Not on file  . Number of children: Not on file  . Years of education: Not on file  . Highest education level: Not on file  Occupational History  . Not on file  Social Needs  . Financial resource strain: Not on file  . Food insecurity:    Worry: Not on file    Inability: Not on file  . Transportation needs:    Medical: Not on file    Non-medical: Not on file  Tobacco Use  . Smoking status: Never Smoker  . Smokeless tobacco: Never Used  Substance and Sexual Activity  . Alcohol use: Yes    Alcohol/week: 1.0 standard drinks    Types: 1 Glasses of wine per week  . Drug use: No  .  Sexual activity: Not Currently    Birth control/protection: None  Lifestyle  . Physical activity:    Days per week: Not on file    Minutes per session: Not on file  . Stress: Not on file  Relationships  . Social connections:    Talks on phone: Not on file    Gets together: Not on file    Attends religious service: Not on file    Active member of club or organization: Not on file    Attends meetings of clubs or organizations: Not on file    Relationship status: Not on file  Other Topics Concern  . Not on file  Social History Narrative  . Not on file    Allergies: No Known Allergies  Metabolic Disorder Labs: Lab Results  Component Value Date   HGBA1C 5.1 08/05/2014   MPG 100  08/05/2014   No results found for: PROLACTIN Lab Results  Component Value Date   CHOL 222 (H) 08/05/2014   TRIG 151 (H) 08/05/2014   HDL 45 08/05/2014   CHOLHDL 4.9 08/05/2014   VLDL 30 08/05/2014   LDLCALC 147 (H) 08/05/2014   No results found for: TSH  Therapeutic Level Labs: No results found for: LITHIUM No results found for: VALPROATE No components found for:  CBMZ  Current Medications: Current Outpatient Medications  Medication Sig Dispense Refill  . atorvastatin (LIPITOR) 10 MG tablet 10 mg.  6  . finasteride (PROSCAR) 5 MG tablet     . lamoTRIgine (LAMICTAL) 200 MG tablet TAKE 2 TABLETS(400 MG) BY MOUTH DAILY 120 tablet 1  . naproxen (NAPROSYN) 250 MG tablet Take by mouth as needed.     No current facility-administered medications for this visit.      Musculoskeletal: Strength & Muscle Tone: within normal limits Gait & Station: normal Patient leans: N/A  Psychiatric Specialty Exam: ROS  Blood pressure 120/72, pulse 74, height 6' (1.829 m), weight 182 lb (82.6 kg).Body mass index is 24.68 kg/m.  General Appearance: Casual  Eye Contact:  Good  Speech:  Clear and Coherent  Volume:  Normal  Mood:  Euthymic  Affect:  Appropriate  Thought Process:  Goal Directed  Orientation:  Full (Time, Place, and Person)  Thought Content: Logical   Suicidal Thoughts:  No  Homicidal Thoughts:  No  Memory:  Immediate;   Good Recent;   Good Remote;   Good  Judgement:  Good  Insight:  Good  Psychomotor Activity:  Normal  Concentration:  Concentration: Good and Attention Span: Good  Recall:  Good  Fund of Knowledge: Good  Language: Good  Akathisia:  No  Handed:  Right  AIMS (if indicated): not done  Assets:  Communication Skills Desire for Improvement Housing Resilience  ADL's:  Intact  Cognition: WNL  Sleep:  Good   Screenings:   Assessment and Plan: I Polar disorder type I.  Patient is a stable on Lamictal 400 mg daily.  He has no side effects.  He is  scheduled to have his blood work and physical in January.  I recommended to call us back if is any question or any concern.  Follow-up in 3 months.  Encourage healthy lifestyle and watch his calorie intake.   Cleotis Nipper, MD 09/03/2018, 8:24 AM

## 2018-12-03 ENCOUNTER — Ambulatory Visit (INDEPENDENT_AMBULATORY_CARE_PROVIDER_SITE_OTHER): Payer: BLUE CROSS/BLUE SHIELD | Admitting: Psychiatry

## 2018-12-03 ENCOUNTER — Encounter (HOSPITAL_COMMUNITY): Payer: Self-pay | Admitting: Psychiatry

## 2018-12-03 DIAGNOSIS — F310 Bipolar disorder, current episode hypomanic: Secondary | ICD-10-CM | POA: Diagnosis not present

## 2018-12-03 MED ORDER — LAMOTRIGINE 200 MG PO TABS: ORAL_TABLET | ORAL | 0 refills | Status: DC

## 2018-12-03 NOTE — Progress Notes (Signed)
BH MD/PA/NP OP Progress Note  12/03/2018 8:47 AM Tom [name]  MRN:  [id number]  Chief Complaint: I am doing fine.  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      HPI: Tom [name] came for his appointment.  He has been busy these days as he is working for [employer].  He is taking his medication as prescribed.  Some nights he works very late and get tired in the morning.  But he is a still getting 7 to 8 hours of sleep.  His mood is a stable.  He denies any irritability, mania or any impulsive behavior.  He is going to [place] with his wife in first week of April for 8 weeks.  He denies any hallucination or any paranoia.  He denies any crying spells or any feeling of hopelessness or worthlessness.  He is taking Lamictal for a long time and he feels it is working.  He has no rash, itching or tremors.  He denies drinking or using any illegal substances.  His energy level is good.    Visit Diagnosis:    ICD-10-CM   1. Bipolar disorder, most recent episode hypomanic (HCC) F31.0 lamoTRIgine (LAMICTAL) 200 MG tablet    Past Psychiatric History: Reviewed. H/O impulsive behavior, mania, delusions, grandiosity and punching in the walls.  H/O legal issues and charged for disorderly conduct and DUI.  Had a brief psychiatric hospitalization for 72 hours in [place] for agitation and violence. Tried Zoloft, Prozac and lithium with good response. Depakote and Abilify did not helped. Took Vistaril and Ativan for anxiety which helped. No H/O suicidal attempt or any paranoia.  Past Medical History:  Past Medical History:  Diagnosis Date  . Bipolar disorder [facility])     Past Surgical History:  Procedure Laterality Date  . HERNIA REPAIR      Family Psychiatric History: Reviewed.  Family History:  Family History  Problem Relation Age of Onset  . Bipolar disorder Father   . Alcohol abuse Father   . Depression Mother   . Bipolar disorder Brother   . Depression Brother   . Drug abuse Brother   .  Depression Brother     Social History:  Social History   Socioeconomic History  . Marital status: Married    Spouse name: Not on file  . Number of children: Not on file  . Years of education: Not on file  . Highest education level: Not on file  Occupational History  . Not on file  Social Needs  . Financial resource strain: Not on file  . Food insecurity:    Worry: Not on file    Inability: Not on file  . Transportation needs:    Medical: Not on file    Non-medical: Not on file  Tobacco Use  . Smoking status: Never Smoker  . Smokeless tobacco: Never Used  Substance and Sexual Activity  . Alcohol use: Yes    Alcohol/week: 1.0 standard drinks    Types: 1 Glasses of wine per week  . Drug use: No  . Sexual activity: Not Currently    Birth control/protection: None  Lifestyle  . Physical activity:    Days per week: Not on file    Minutes per session: Not on file  . Stress: Not on file  Relationships  . Social connections:    Talks on phone: Not on file    Gets together: Not on file    Attends religious service: Not on file  Active member of club or organization: Not on file    Attends meetings of clubs or organizations: Not on file    Relationship status: Not on file  Other Topics Concern  . Not on file  Social History Narrative  . Not on file    Allergies: No Known Allergies  Metabolic Disorder Labs: Lab Results  Component Value Date   HGBA1C 5.1 08/05/2014   MPG 100 08/05/2014   No results found for: PROLACTIN Lab Results  Component Value Date   CHOL 222 (H) 08/05/2014   TRIG 151 (H) 08/05/2014   HDL 45 08/05/2014   CHOLHDL 4.9 08/05/2014   VLDL 30 08/05/2014   LDLCALC 147 (H) 08/05/2014   No results found for: TSH  Therapeutic Level Labs: No results found for: LITHIUM No results found for: VALPROATE No components found for:  CBMZ  Current Medications: Current Outpatient Medications  Medication Sig Dispense Refill  . atorvastatin (LIPITOR)  10 MG tablet 10 mg.  6  . finasteride (PROSCAR) 5 MG tablet     . lamoTRIgine (LAMICTAL) 200 MG tablet TAKE 2 TABLETS(400 MG) BY MOUTH DAILY 180 tablet 0  . naproxen (NAPROSYN) 250 MG tablet Take by mouth as needed.     No current facility-administered medications for this visit.      Musculoskeletal: Strength & Muscle Tone: within normal limits Gait & Station: normal Patient leans: N/A  Psychiatric Specialty Exam: ROS  Blood pressure 128/72, pulse 68, height 6' (1.829 m), weight 179 lb (81.2 kg).Body mass index is 24.28 kg/m.  General Appearance: Casual  Eye Contact:  Good  Speech:  Normal Rate  Volume:  Normal  Mood:  Euthymic  Affect:  Appropriate  Thought Process:  Goal Directed  Orientation:  Full (Time, Place, and Person)  Thought Content: Logical   Suicidal Thoughts:  No  Homicidal Thoughts:  No  Memory:  Immediate;   Good Recent;   Good Remote;   Good  Judgement:  Good  Insight:  Good  Psychomotor Activity:  Normal  Concentration:  Concentration: Good and Attention Span: Good  Recall:  Good  Fund of Knowledge: Good  Language: Good  Akathisia:  No  Handed:  Right  AIMS (if indicated): not done  Assets:  Communication Skills Desire for Improvement Housing Resilience Social Support  ADL's:  Intact  Cognition: WNL  Sleep:  Good   Screenings:   Assessment and Plan: Bipolar disorder type I.  Patient is a stable on his current medication.  He has no rash, itching or tremors.  Recently he had a blood work which he forgot to bring it but promised to fax to our office.  Patient reported he was told that everything is normal.  We will continue Lamictal 400 mg daily.  Discussed medication side effects and benefits.  Recommended to call us back if is any question or any concern.  Follow-up in 3 months.     Cleotis Nipper, MD 12/03/2018, 8:47 AM

## 2019-02-13 ENCOUNTER — Other Ambulatory Visit: Payer: Self-pay

## 2019-02-13 ENCOUNTER — Ambulatory Visit (INDEPENDENT_AMBULATORY_CARE_PROVIDER_SITE_OTHER): Payer: BLUE CROSS/BLUE SHIELD | Admitting: Psychiatry

## 2019-02-13 ENCOUNTER — Encounter (HOSPITAL_COMMUNITY): Payer: Self-pay | Admitting: Psychiatry

## 2019-02-13 DIAGNOSIS — F310 Bipolar disorder, current episode hypomanic: Secondary | ICD-10-CM

## 2019-02-13 MED ORDER — LAMOTRIGINE 200 MG PO TABS: ORAL_TABLET | ORAL | 0 refills | Status: DC

## 2019-02-13 NOTE — Progress Notes (Signed)
Virtual Visit via Telephone Note  I connected with Tom [name] on 02/13/19 at  8:40 AM EDT by telephone and verified that I am speaking with the correct person using two identifiers.   I discussed the limitations, risks, security and privacy concerns of performing an evaluation and management service by telephone and the availability of in person appointments. I also discussed with the patient that there may be a patient responsible charge related to this service. The patient expressed understanding and agreed to proceed.   History of Present Illness: Patient evaluated through phone conversation.  He admitted some anxiety due to pandemic coronavirus as he cannot travel to [place] which we will schedule in few days.  However after done some research he decided to rebook his flight in August.  He is hoping at that time things will be better and the area he usually go in [place] will be not as effective.  Overall he described that his mood is stable.  He denies any irritability, anger, mania, psychosis.  He is sleeping good.  He is walking and sometimes biking with his wife.  We try to keep himself busy.  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  He describes his appetite is okay.  He is trying to keep his weight stable.  He has no tremors shakes or any EPS.  He has a blood work in January which he was told everything is normal.  Patient denies drinking or using any illegal substances.  He like to keep his medication unchanged.   Past Psychiatric History: Reviewed. H/O impulsive behavior, mania, delusions, grandiosity and punching in the walls.  H/O legal issues and charged for disorderly conduct and DUI.  Had abrief psychiatric hospitalization for 72 hours in [place] for agitation and violence. Tried Zoloft, Prozac and lithium with good response. Depakote and Abilify did not helped. Took Vistaril and Ativan for anxiety which helped.  No H/O suicidal attempt or any paranoia.  Observations/Objective: Mental status examination done on the phone.  Patient describes his mood okay.  His speech is clear and coherent.  His thought process logical, goal-directed and there were no flight of ideas or loose association.  He denies any auditory or visual hallucination.  He denies any active or passive suicidal thoughts or homicidal thought.  There were no delusions, paranoia or grandiosity.  His attention and concentration is okay.  He is alert and oriented x3.  His fund of knowledge is adequate.  His cognition is good.  His insight judgment is okay.  Assessment and Plan: Bipolar disorder type I.  Patient is stable on Lamictal 400 mg daily.  He has no rash, itching tremors or shakes.  His energy level is good.  He admitted some concerns due to pandemic coronavirus but he understand it is everywhere.  He is focused on his next trip to [place] in August.  Discussed medication side effects and benefits.  Recommended to call us back if he has any question or any concern.  Follow-up in 3 months.  Follow Up Instructions:    I discussed the assessment and treatment plan with the patient. The patient was provided an opportunity to ask questions and all were answered. The patient agreed with the plan and demonstrated an understanding of the instructions.   The patient was advised to call back or seek an in-person evaluation if the symptoms worsen or if the condition fails to improve  as anticipated.  I provided 20 minutes of non-face-to-face time during this encounter.   Kathlee Nations, MD

## 2019-05-15 ENCOUNTER — Ambulatory Visit (HOSPITAL_COMMUNITY): Payer: BLUE CROSS/BLUE SHIELD | Admitting: Psychiatry

## 2019-05-21 ENCOUNTER — Other Ambulatory Visit: Payer: Self-pay

## 2019-05-21 ENCOUNTER — Ambulatory Visit (INDEPENDENT_AMBULATORY_CARE_PROVIDER_SITE_OTHER): Payer: BLUE CROSS/BLUE SHIELD | Admitting: Psychiatry

## 2019-05-21 ENCOUNTER — Encounter (HOSPITAL_COMMUNITY): Payer: Self-pay | Admitting: Psychiatry

## 2019-05-21 DIAGNOSIS — F310 Bipolar disorder, current episode hypomanic: Secondary | ICD-10-CM

## 2019-05-21 DIAGNOSIS — F419 Anxiety disorder, unspecified: Secondary | ICD-10-CM

## 2019-05-21 MED ORDER — LAMOTRIGINE 200 MG PO TABS: ORAL_TABLET | ORAL | 0 refills | Status: DC

## 2019-05-21 NOTE — Progress Notes (Signed)
Virtual Visit via Telephone Note  I connected with Tom [name] on 05/21/19 at  8:20 AM EDT by telephone and verified that I am speaking with the correct person using two identifiers.   I discussed the limitations, risks, security and privacy concerns of performing an evaluation and management service by telephone and the availability of in person appointments. I also discussed with the patient that there may be a patient responsible charge related to this service. The patient expressed understanding and agreed to proceed.   History of Present Illness: Patient was evaluated through phone session.  He admitted some disappointment and sadness as not able to go to [place] and now plan is postponed for 2021.  He is keeping himself busy by doing exercise, walking and biking.  He usually bikes 3 to 4 hours a day.  He denies any irritability, severe mood swing or any manic-like symptoms.  He sometimes gets frustrated with the current political situation but he had been stay away from the noose and media.  He is sleeping good.  His energy level is good.  Denies any crying spells or any feeling of hopelessness.  No new medicine added.  He has no rash, itching tremors or shakes.  He reported his weight is stable.  He has no tremors, rash or any itching.   Past Psychiatric History:Reviewed. H/Oimpulsive behavior, mania, delusions, grandiosity and punching in the walls.H/Olegal issues and charged for disorderly conduct and DUI[place]. Tried Zoloft, Prozac and lithium with good response. Depakote andAbilify did not helped. TookVistaril and Ativan for anxiety which helped. No H/Osuicidal attempt or any paranoia.  Psychiatric Specialty Exam: Physical Exam  ROS  There were no vitals taken for this visit.There is no height or weight on file to calculate BMI.  General Appearance: NA  Eye Contact:  NA  Speech:  Clear and  Coherent and Normal Rate  Volume:  Normal  Mood:  Euthymic  Affect:  NA  Thought Process:  Goal Directed  Orientation:  Full (Time, Place, and Person)  Thought Content:  WDL and Logical  Suicidal Thoughts:  No  Homicidal Thoughts:  No  Memory:  Immediate;   Good Recent;   Good Remote;   Good  Judgement:  Good  Insight:  Good  Psychomotor Activity:  Normal  Concentration:  Concentration: Good and Attention Span: Good  Recall:  Good  Fund of Knowledge:  Good  Language:  Good  Akathisia:  No  Handed:  Right  AIMS (if indicated):     Assets:  Communication Skills Desire for [place] Talents/Skills Transportation  ADL's:  Intact  Cognition:  WNL  Sleep:   good      Assessment and Plan: Bipolar disorder type I.  Anxiety.  Discuss his current anxiety due to pandemic and not able to go to [place].  He is hopeful to move [place] next year.  He does not want to change medication since it is working well.  He has no rash or itching.  Continue Lamictal 400 mg daily.  Discussed medication side effects and benefits.  Encourage healthy lifestyle.  Recommended to call us back if is any question or any concern.  Follow-up in 3 months.  Follow Up Instructions:    I discussed the assessment and treatment plan with the patient. The patient was provided an opportunity to ask questions and all were answered. The patient agreed with the plan and demonstrated an  understanding of the instructions.   The patient was advised to call back or seek an in-person evaluation if the symptoms worsen or if the condition fails to improve as anticipated.  I provided 15 minutes of non-face-to-face time during this encounter.   Kathlee Nations, MD

## 2019-07-09 ENCOUNTER — Other Ambulatory Visit (HOSPITAL_COMMUNITY): Payer: Self-pay | Admitting: Psychiatry

## 2019-07-09 DIAGNOSIS — F310 Bipolar disorder, current episode hypomanic: Secondary | ICD-10-CM

## 2019-08-20 ENCOUNTER — Ambulatory Visit (INDEPENDENT_AMBULATORY_CARE_PROVIDER_SITE_OTHER): Payer: BLUE CROSS/BLUE SHIELD | Admitting: Psychiatry

## 2019-08-20 ENCOUNTER — Encounter (HOSPITAL_COMMUNITY): Payer: Self-pay | Admitting: Psychiatry

## 2019-08-20 ENCOUNTER — Other Ambulatory Visit: Payer: Self-pay

## 2019-08-20 DIAGNOSIS — F419 Anxiety disorder, unspecified: Secondary | ICD-10-CM | POA: Diagnosis not present

## 2019-08-20 DIAGNOSIS — F310 Bipolar disorder, current episode hypomanic: Secondary | ICD-10-CM | POA: Diagnosis not present

## 2019-08-20 MED ORDER — LAMOTRIGINE 200 MG PO TABS: ORAL_TABLET | ORAL | 0 refills | Status: DC

## 2019-08-20 NOTE — Progress Notes (Signed)
Virtual Visit via Telephone Note  I connected with Tom [name] on 08/20/19 at  3:00 PM EDT by telephone and verified that I am speaking with the correct person using two identifiers.   I discussed the limitations, risks, security and privacy concerns of performing an evaluation and management service by telephone and the availability of in person appointments. I also discussed with the patient that there may be a patient responsible charge related to this service. The patient expressed understanding and agreed to proceed.   History of Present Illness: Patient was evaluated through phone session.  He is compliant with Lamictal and reported no tremors, rash or any itching.  He admitted some irritability lately when he watches political news.  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  He admitted this appointment with the political situation and decided if current president wings again then he will definitely moved to [place].  He admitted some anxiety but denies any agitation, mania, anger or any feeling of hopelessness or suicidal thoughts.  He continues to bike 3 to 4 hours a day which keep him calm.  His energy level is good.  His appetite is okay.  He is sleeping good.  He denies drinking or using any illegal substances.  Past Psychiatric History:Reviewed. H/Oimpulsive behavior, mania, delusions, grandiosity and punching in the walls.H/Olegal issues and charged for disorderly conduct and DUI. [place]. Tried Zoloft, Prozac and lithium with good response. Depakote andAbilify did not helped. TookVistaril and Ativan for anxiety which helped. No H/Osuicidal attempt or any paranoia.    Psychiatric Specialty Exam: Physical Exam  ROS  There were no vitals taken for this visit.There is no height or weight on file to calculate BMI.   General Appearance: NA  Eye Contact:  NA  Speech:  Clear and Coherent  Volume:  Normal  Mood:  Euthymic  Affect:  NA  Thought Process:  Goal Directed  Orientation:  Full (Time, Place, and Person)  Thought Content:  WDL and Logical  Suicidal Thoughts:  No  Homicidal Thoughts:  No  Memory:  Immediate;   Good Recent;   Good Remote;   Good  Judgement:  Good  Insight:  Good  Psychomotor Activity:  NA  Concentration:  Concentration: Good and Attention Span: Good  Recall:  Good  Fund of Knowledge:  Good  Language:  Good  Akathisia:  No  Handed:  Right  AIMS (if indicated):     Assets:  Communication Skills Desire for Improvement Housing Resilience Social Support  ADL's:  Intact  Cognition:  WNL  Sleep:   good      Assessment and Plan: Bipolar disorder type I.  Anxiety.  Discussed pandemic and political situation.  Recommended not to watch news and stay focused on daily biking.  He feels the current medicine is working and promises he has any issues then he will call us.  Continue Lamictal 400 mg daily.  He has no rash, itching, tremors or shakes.  Encourage healthy lifestyle.  Follow-up in 3 months.  Follow Up Instructions:    I discussed the assessment and treatment plan with the patient. The patient was provided an opportunity to ask questions and all were answered. The patient agreed with the plan and demonstrated an understanding of the instructions.   The patient was advised to call back or seek an in-person evaluation if the symptoms worsen  or if the condition fails to improve as anticipated.  I provided 20 minutes of non-face-to-face time during this encounter.   Kathlee Nations, MD

## 2019-08-24 ENCOUNTER — Telehealth (HOSPITAL_COMMUNITY): Payer: Self-pay

## 2019-08-24 NOTE — Telephone Encounter (Signed)
He took Vistaril in the past 2 help anxiety.  You can call Vistaril 25 mg capsule as needed #10 to his local pharmacy.

## 2019-08-24 NOTE — Telephone Encounter (Signed)
Patient is calling to see if he can get something for anxiety, he would like something light, please review and advise, thank you

## 2019-08-28 MED ORDER — HYDROXYZINE HCL 25 MG PO TABS: 25.0000 mg | ORAL_TABLET | Freq: Every day | ORAL | 0 refills | Status: DC

## 2019-08-28 NOTE — Telephone Encounter (Signed)
Called in the Vistaril and called patient to let him know

## 2019-11-12 ENCOUNTER — Ambulatory Visit
Admission: RE | Admit: 2019-11-12 | Discharge: 2019-11-12 | Disposition: A | Discharge status: Home / Self Care | Payer: BLUE CROSS/BLUE SHIELD | Source: Ambulatory Visit | Attending: Internal Medicine | Admitting: Internal Medicine

## 2019-11-12 ENCOUNTER — Other Ambulatory Visit: Payer: Self-pay | Admitting: Internal Medicine

## 2019-11-12 DIAGNOSIS — M79672 Pain in left foot: Secondary | ICD-10-CM

## 2019-11-23 ENCOUNTER — Ambulatory Visit (INDEPENDENT_AMBULATORY_CARE_PROVIDER_SITE_OTHER): Payer: BLUE CROSS/BLUE SHIELD | Admitting: Psychiatry

## 2019-11-23 ENCOUNTER — Encounter (HOSPITAL_COMMUNITY): Payer: Self-pay | Admitting: Psychiatry

## 2019-11-23 ENCOUNTER — Other Ambulatory Visit: Payer: Self-pay

## 2019-11-23 DIAGNOSIS — F338 Other recurrent depressive disorders: Secondary | ICD-10-CM

## 2019-11-23 DIAGNOSIS — F310 Bipolar disorder, current episode hypomanic: Secondary | ICD-10-CM | POA: Diagnosis not present

## 2019-11-23 MED ORDER — LAMOTRIGINE 200 MG PO TABS: ORAL_TABLET | ORAL | 1 refills | Status: DC

## 2019-11-23 NOTE — Progress Notes (Signed)
Virtual Visit via Telephone Note  I connected with Tom [name] on 11/23/19 at 10:20 AM EST by telephone and verified that I am speaking with the correct person using two identifiers.   I discussed the limitations, risks, security and privacy concerns of performing an evaluation and management service by telephone and the availability of in person appointments. I also discussed with the patient that there may be a patient responsible charge related to this service. The patient expressed understanding and agreed to proceed.   History of Present Illness: Patient was evaluated through phone session.  He admitted lately on the downside because has not able to go outside and to exercise.  He had seasonal affective disorder and sometimes cold days became sad and depressed.  He admitted not able to go to the gym because of extreme cold weather but now he like to resume in few days.  He is happy that his wife was Pfizer trial and she got her vaccination.  Patient told they are still thinking about moving to [place] but now they have to wait due to Covid.  Denies any mania, psychosis, hallucination.  Denies any crying spells or any feeling of hopelessness or worthlessness.  He has taken few times hydroxyzine to calm his anxiety before the election and after the election results.  However things are much better and he does not feel he need any more medication.  His appetite is okay.  His weight is unchanged from the past.  No new medicine added since the last visit.  He has no rash, itching tremors or shakes.   Past Psychiatric History:Reviewed. H/Oimpulsive behavior, mania, delusions, grandiosity and punching in the walls.H/Olegal issues and charged for disorderly conduct and DUI. [place]. Tried Zoloft, Prozac and lithium with good response. Depakote andAbilify did not helped. TookVistaril and Ativan for anxiety which  helped. No H/Osuicidal attempt or any paranoia.  Psychiatric Specialty Exam: Physical Exam  Review of Systems  There were no vitals taken for this visit.There is no height or weight on file to calculate BMI.  General Appearance: NA  Eye Contact:  NA  Speech:  Clear and Coherent and Normal Rate  Volume:  Normal  Mood:  Euthymic  Affect:  NA  Thought Process:  Goal Directed  Orientation:  Full (Time, Place, and Person)  Thought Content:  Logical  Suicidal Thoughts:  No  Homicidal Thoughts:  No  Memory:  Immediate;   Good Recent;   Good Remote;   Good  Judgement:  Good  Insight:  Good  Psychomotor Activity:  NA  Concentration:  Concentration: Good and Attention Span: Good  Recall:  Good  Fund of Knowledge:  Good  Language:  Good  Akathisia:  No  Handed:  Right  AIMS (if indicated):     Assets:  Communication Skills Desire for Improvement Housing Resilience Social Support  ADL's:  Intact  Cognition:  WNL  Sleep:   7-8 hrs      Assessment and Plan: Bipolar disorder type I.  Seasonal affective disorder.  Encourage to walk if he cannot go to [facility].  However patient had decided to go to the [facility] since his wife is going and she feels comfortable then he can start [employer].  He does not want to adjust his medication.  He like to continue Lamictal 400 mg daily.  He has no rash, itching tremors or shakes.  He like to go back to 6 months follow-up and hopefully  at that time he had clear understanding about his move to [place].  Recommended to call us back if is any question or any concern.  Follow-up in 6 months.  Patient was evaluated by phone session.  Follow Up Instructions:    I discussed the assessment and treatment plan with the patient. The patient was provided an opportunity to ask questions and all were answered. The patient agreed with the plan and demonstrated an understanding of the instructions.   The patient was advised to call back or seek an in-person evaluation if  the symptoms worsen or if the condition fails to improve as anticipated.  I provided 20 minutes of non-face-to-face time during this encounter.   Cleotis Nipper, MD

## 2020-05-13 ENCOUNTER — Other Ambulatory Visit (HOSPITAL_COMMUNITY): Payer: Self-pay | Admitting: *Deleted

## 2020-05-13 ENCOUNTER — Telehealth (HOSPITAL_COMMUNITY): Payer: Self-pay | Admitting: *Deleted

## 2020-05-13 MED ORDER — HYDROXYZINE HCL 25 MG PO TABS: 25.0000 mg | ORAL_TABLET | Freq: Every day | ORAL | 0 refills | Status: DC
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
## 2020-05-14 ENCOUNTER — Other Ambulatory Visit: Payer: Self-pay

## 2020-05-14 ENCOUNTER — Emergency Department (HOSPITAL_COMMUNITY): Payer: BLUE CROSS/BLUE SHIELD

## 2020-05-14 ENCOUNTER — Emergency Department (HOSPITAL_COMMUNITY)
Admission: EM | Admit: 2020-05-14 | Discharge: 2020-05-14 | Disposition: A | Discharge status: Home / Self Care | Payer: BLUE CROSS/BLUE SHIELD | Attending: Emergency Medicine | Admitting: Emergency Medicine

## 2020-05-14 ENCOUNTER — Encounter (HOSPITAL_COMMUNITY): Payer: Self-pay | Admitting: Emergency Medicine

## 2020-05-14 DIAGNOSIS — Z5321 Procedure and treatment not carried out due to patient leaving prior to being seen by health care provider: Secondary | ICD-10-CM | POA: Insufficient documentation

## 2020-05-14 DIAGNOSIS — M25551 Pain in right hip: Secondary | ICD-10-CM | POA: Insufficient documentation

## 2020-05-14 DIAGNOSIS — Z041 Encounter for examination and observation following transport accident: Secondary | ICD-10-CM | POA: Diagnosis present

## 2020-05-14 DIAGNOSIS — M79641 Pain in right hand: Secondary | ICD-10-CM | POA: Diagnosis not present

## 2020-05-14 NOTE — ED Notes (Signed)
Pt has decided to leave and go to Urgent Care.
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
## 2020-05-17 ENCOUNTER — Ambulatory Visit
Admission: EM | Admit: 2020-05-17 | Discharge: 2020-05-17 | Disposition: A | Discharge status: Home / Self Care | Payer: BLUE CROSS/BLUE SHIELD | Attending: Emergency Medicine | Admitting: Emergency Medicine

## 2020-05-17 ENCOUNTER — Other Ambulatory Visit: Payer: Self-pay

## 2020-05-17 DIAGNOSIS — F419 Anxiety disorder, unspecified: Secondary | ICD-10-CM | POA: Diagnosis not present

## 2020-05-17 MED ORDER — CYCLOBENZAPRINE HCL 10 MG PO TABS: 10.0000 mg | ORAL_TABLET | Freq: Three times a day (TID) | ORAL | 0 refills | Status: AC | PRN

## 2020-05-17 MED ORDER — DIAZEPAM 5 MG PO TABS: 5.0000 mg | ORAL_TABLET | Freq: Four times a day (QID) | ORAL | 0 refills | Status: DC | PRN

## 2020-05-17 NOTE — Discharge Instructions (Signed)
I would highly recommend getting a counselor at least for the next few weeks.  Valium will help.  Try the the teas as we discussed.  Please discuss the symptoms with your psychiatrist when you see him, you may benefit from being on an additional medication for at least the next several weeks.  Hope you feel better soon.  Continue ibuprofen 600 mg combined with 1000 mg of Tylenol 3-4 times a day, Flexeril as needed for musculoskeletal pain.
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
## 2020-05-23 ENCOUNTER — Encounter (HOSPITAL_COMMUNITY): Payer: Self-pay | Admitting: Psychiatry

## 2020-05-23 ENCOUNTER — Other Ambulatory Visit: Payer: Self-pay

## 2020-05-23 ENCOUNTER — Ambulatory Visit (INDEPENDENT_AMBULATORY_CARE_PROVIDER_SITE_OTHER): Payer: BLUE CROSS/BLUE SHIELD | Admitting: Psychiatry

## 2020-05-23 VITALS — Wt 171.0 lb

## 2020-05-23 DIAGNOSIS — F419 Anxiety disorder, unspecified: Secondary | ICD-10-CM

## 2020-05-23 DIAGNOSIS — F310 Bipolar disorder, current episode hypomanic: Secondary | ICD-10-CM

## 2020-05-23 MED ORDER — LAMOTRIGINE 200 MG PO TABS: ORAL_TABLET | ORAL | 0 refills | Status: DC

## 2020-05-23 MED ORDER — HYDROXYZINE HCL 25 MG PO TABS: ORAL_TABLET | ORAL | 0 refills | Status: DC
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
## 2020-05-31 ENCOUNTER — Other Ambulatory Visit: Payer: Self-pay

## 2020-05-31 ENCOUNTER — Telehealth (INDEPENDENT_AMBULATORY_CARE_PROVIDER_SITE_OTHER): Payer: BLUE CROSS/BLUE SHIELD | Admitting: Psychiatry

## 2020-05-31 DIAGNOSIS — F310 Bipolar disorder, current episode hypomanic: Secondary | ICD-10-CM | POA: Diagnosis not present

## 2020-05-31 DIAGNOSIS — F419 Anxiety disorder, unspecified: Secondary | ICD-10-CM | POA: Diagnosis not present

## 2020-05-31 MED ORDER — DIAZEPAM 5 MG PO TABS: 5.0000 mg | ORAL_TABLET | ORAL | 0 refills | Status: AC | PRN

## 2020-05-31 NOTE — Progress Notes (Signed)
Virtual Visit via Telephone Note  I connected with Tom [name] on 05/31/20 at 10:00 AM EDT by telephone and verified that I am speaking with the correct person using two identifiers.  Location: Patient: home Provider: home office   I discussed the limitations, risks, security and privacy concerns of performing an evaluation and management service by telephone and the availability of in person appointments. I also discussed with the patient that there may be a patient responsible charge related to this service. The patient expressed understanding and agreed to proceed.   History of Present Illness: Patient is evaluated by phone session.  He was last evaluated week ago but requested within a week appointment as today he is going to [place].  He is relaxed and relieved that things are going well and he is scheduled to leave [place] this afternoon from [place] airport.  He has taken Valium and hydroxyzine to calm him down.  He feels it helps sleep and denies any recent anger, irritability or severe mood swings.  He like to things behind because he wanted to enjoy his trip to [place].  He is going to 3 months with his wife.  His plan is to explore [place] if he decided to choose has a permanent location to live.  He is pleased that his insurance claim is processed though he was not happy with the outcome but at least he got some money back.  He is compliant with Lamictal.  He has no rash or any itching.  He is down to only 2 Valium but like to have one more prescription to have security and keep him relaxed on his journey to [place].  He denies any mania, psychosis, hallucination or any mood swings.  His energy level is okay.  His appetite is okay..  Past Psychiatric History:Reviewed. H/Oimpulsive behavior, mania, delusions, grandiosity and punching in the walls.H/Olegal issues and charged for disorderly conduct and DUI. [place]. Tried Zoloft, Prozac and lithium with good response. Depakote andAbilify did not helped. TookVistaril and Ativan for anxiety which helped. No H/Osuicidal attempt or any paranoia.   Psychiatric Specialty Exam: Physical Exam  Review of Systems  There were no vitals taken for this visit.There is no height or weight on file to calculate BMI.  General Appearance: NA  Eye Contact:  NA  Speech:  Clear and Coherent  Volume:  Normal  Mood:  Anxious  Affect:  NA  Thought Process:  Goal Directed  Orientation:  Full (Time, Place, and Person)  Thought Content:  WDL  Suicidal Thoughts:  No  Homicidal Thoughts:  No  Memory:  Immediate;   Good Recent;   Good Remote;   Good  Judgement:  Good  Insight:  Good  Psychomotor Activity:  NA  Concentration:  Concentration: Good and Attention Span: Good  Recall:  Good  Fund of Knowledge:  Good  Language:  Good  Akathisia:  No  Handed:  Right  AIMS (if indicated):     Assets:  Communication Skills Desire for Improvement Housing Resilience Social Support Talents/Skills Transportation Vocational/Educational  ADL's:  Intact  Cognition:  WNL  Sleep:   good     Assessment and Plan: Bipolar disorder type I.  Anxiety.  Patient doing better and excited about going to [place] this afternoon.  He like to have a new prescription of Valium to take as needed for severe anxiety.  He has 2 tablets left over which was given by his PCP  he wanted to have a prescription in case he needed.  We had discussed benzodiazepine dependence tolerance and withdrawal.  His plan is to not to take unless he is very anxious.  He had psychiatrist in [place] in case he needed some help or medication management there.  However he like to have follow-up in November once he returned from [place] and then he will decide if he wants to move permanent.  He has enough Lamictal and hydroxyzine.  We will call Valium 5 mg to take as needed for severe anxiety #15.  Follow-up  in November.  Patient had a psychiatrist in [place] in case he needed some help.  Follow Up Instructions:    I discussed the assessment and treatment plan with the patient. The patient was provided an opportunity to ask questions and all were answered. The patient agreed with the plan and demonstrated an understanding of the instructions.   The patient was advised to call back or seek an in-person evaluation if the symptoms worsen or if the condition fails to improve as anticipated.  I provided 15 minutes of non-face-to-face time during this encounter.   Cleotis Nipper, MD

## 2020-08-22 ENCOUNTER — Other Ambulatory Visit (HOSPITAL_COMMUNITY): Payer: Self-pay | Admitting: Psychiatry

## 2020-08-22 DIAGNOSIS — F310 Bipolar disorder, current episode hypomanic: Secondary | ICD-10-CM

## 2020-08-29 ENCOUNTER — Telehealth (HOSPITAL_COMMUNITY): Payer: BLUE CROSS/BLUE SHIELD | Admitting: Psychiatry

## 2020-09-01 ENCOUNTER — Telehealth (INDEPENDENT_AMBULATORY_CARE_PROVIDER_SITE_OTHER): Payer: BLUE CROSS/BLUE SHIELD | Admitting: Psychiatry

## 2020-09-01 ENCOUNTER — Encounter (HOSPITAL_COMMUNITY): Payer: Self-pay | Admitting: Psychiatry

## 2020-09-01 ENCOUNTER — Other Ambulatory Visit: Payer: Self-pay

## 2020-09-01 DIAGNOSIS — F310 Bipolar disorder, current episode hypomanic: Secondary | ICD-10-CM | POA: Diagnosis not present

## 2020-09-01 MED ORDER — LAMOTRIGINE 200 MG PO TABS: ORAL_TABLET | ORAL | 0 refills | Status: DC

## 2020-09-01 NOTE — Progress Notes (Signed)
Virtual Visit via Telephone Note  I connected with Tom [name] on 09/01/20 at 10:00 AM EST by telephone and verified that I am speaking with the correct person using two identifiers.  Location: Patient: Home  Provider: Home Office   I discussed the limitations, risks, security and privacy concerns of performing an evaluation and management service by telephone and the availability of in person appointments. I also discussed with the patient that there may be a patient responsible charge related to this service. The patient expressed understanding and agreed to proceed.   History of Present Illness: Patient is evaluated by phone session.  He just back from [place] last week and is still adjusting back to his home.  Patient told he had a very good time in [place] and he was able to make more friends in his trip.  His next trip is scheduled in May.  He feels his mood is been stable.  He denies any anxiety, crying spells or any highs and lows.  He denies any irritability and he is sleeping good.  His insurance claim is a still in the process.  He had not use hydroxyzine or Valium in past 3 months.  Patient told he had misplaced these medication and he does not feel he needed now.  His appetite is okay.  He admitted few pound weight gain because of increased appetite but now he is watching his calorie intake.  He does not want to change the medication.  Past Psychiatric History:Reviewed. H/Oimpulsive behavior, mania, delusions, grandiosity and punching in the walls.H/Olegal issues and charged for disorderly conduct and DUI. [place]. Tried Zoloft, Prozac and lithium with good response. Depakote andAbilify did not helped. TookVistaril and Ativan for anxiety which helped. No H/Osuicidal attempt or any paranoia.  Psychiatric Specialty Exam: Physical Exam  Review of Systems  Weight 180 lb (81.6 kg).There is no  height or weight on file to calculate BMI.  General Appearance: NA  Eye Contact:  NA  Speech:  Normal Rate  Volume:  Normal  Mood:  Euthymic  Affect:  NA  Thought Process:  Goal Directed  Orientation:  Full (Time, Place, and Person)  Thought Content:  WDL  Suicidal Thoughts:  No  Homicidal Thoughts:  No  Memory:  Immediate;   Good Recent;   Good Remote;   Good  Judgement:  Good  Insight:  Present  Psychomotor Activity:  NA  Concentration:  Concentration: Good and Attention Span: Good  Recall:  Good  Fund of Knowledge:  Good  Language:  Good  Akathisia:  No  Handed:  Right  AIMS (if indicated):     Assets:  Communication Skills Desire for Improvement Housing Resilience Social Support Talents/Skills Transportation  ADL's:  Intact  Cognition:  WNL  Sleep; ok      Assessment and Plan: Bipolar disorder type I.  Anxiety.  Patient is a stable on current dose of lamotrigine.  He has not use hydroxyzine and Valium in his trip.  He has no rash, itching tremors or shakes.  He like to keep his lamotrigine current dose.  We will continue Lamictal 200 mg twice a day.  Recommended to call us back if is any question or any concern.  Follow-up in 3 months.  Follow Up Instructions:    I discussed the assessment and treatment plan with the patient. The patient was provided an opportunity to ask questions and all were answered. The patient agreed with the plan  and demonstrated an understanding of the instructions.   The patient was advised to call back or seek an in-person evaluation if the symptoms worsen or if the condition fails to improve as anticipated.  I provided 17 minutes of non-face-to-face time during this encounter.   Kathlee Nations, MD

## 2020-09-03 ENCOUNTER — Ambulatory Visit: Payer: BLUE CROSS/BLUE SHIELD | Attending: Internal Medicine

## 2020-09-03 ENCOUNTER — Other Ambulatory Visit: Payer: Self-pay

## 2020-09-03 DIAGNOSIS — Z23 Encounter for immunization: Secondary | ICD-10-CM

## 2020-09-03 NOTE — Progress Notes (Signed)
° °  Covid-19 Vaccination Clinic  Name:  Tom [name]    MRN: [id number] DOB: 07/15/1961  09/03/2020  [name] was observed post Covid-19 immunization for 15 minutes without incident. He was provided with Vaccine Information Sheet and instruction to access the V-Safe system.   [name] was instructed to call 911 with any severe reactions post vaccine:  Difficulty breathing   Swelling of face and throat   A fast heartbeat   A bad rash all over body   Dizziness and weakness   Immunizations Administered    Name Date Dose VIS Date Route   Pfizer COVID-19 Vaccine 09/03/2020  3:15 PM 0.3 mL 08/10/2020 Intramuscular   Manufacturer: [employer], [employer]   Lot: [id number]   NDC: [address]-5

## 2020-11-30 ENCOUNTER — Other Ambulatory Visit: Payer: Self-pay

## 2020-11-30 ENCOUNTER — Telehealth (INDEPENDENT_AMBULATORY_CARE_PROVIDER_SITE_OTHER): Payer: BLUE CROSS/BLUE SHIELD | Admitting: Psychiatry

## 2020-11-30 ENCOUNTER — Encounter (HOSPITAL_COMMUNITY): Payer: Self-pay | Admitting: Psychiatry

## 2020-11-30 DIAGNOSIS — F310 Bipolar disorder, current episode hypomanic: Secondary | ICD-10-CM

## 2020-11-30 DIAGNOSIS — F419 Anxiety disorder, unspecified: Secondary | ICD-10-CM | POA: Diagnosis not present

## 2020-11-30 MED ORDER — HYDROXYZINE HCL 25 MG PO TABS: ORAL_TABLET | ORAL | 0 refills | Status: DC

## 2020-11-30 MED ORDER — LAMOTRIGINE 200 MG PO TABS: ORAL_TABLET | ORAL | 0 refills | Status: DC

## 2020-11-30 NOTE — Progress Notes (Signed)
Virtual Visit via Telephone Note  I connected with Tom [name] on 11/30/20 at 10:00 AM EST by telephone and verified that I am speaking with the correct person using two identifiers.  Location: Patient: Home Provider: Home Office   I discussed the limitations, risks, security and privacy concerns of performing an evaluation and management service by telephone and the availability of in person appointments. I also discussed with the patient that there may be a patient responsible charge related to this service. The patient expressed understanding and agreed to proceed.   History of Present Illness: Patient is evaluated by phone session.  He is excited because he got the visa for [place] so he can stay for at least 3-4 months.  Now he is in the process of buying a ticket and other travel preparation.  He admitted for past few days he is not sleeping as good because he is spending too much about the travel plan.  He admitted some time irritated when he is doing multiple things.  His wife also noticed some time he is talking too fast but it does not interfere in his daily activities.  He noticed due to cold weather not able to do exercise but now since the weather is slowly and gradually getting better he is hoping to start running and biking again.  He had plan to go out for biking on Saturday.  He denies any crying spells, mania or any hallucination.  He is happy about his upcoming [place] trip and this time he is definitely thinking about moving here for long-term.  In the past we have given hydroxyzine which he took it to help his anxiety and he like to have refills since that helps his anxiety, sleep and nervousness.  His appetite is okay.  His weight is stable.  He has no rash.  He is scheduled to have his blood work and PCP visit in coming weeks.  Denies drinking or using any illegal substances.  Past Psychiatric History:Reviewed. H/Oimpulsive behavior, mania, delusions, grandiosity and punching  in the walls.H/Olegal issues and charged for disorderly conduct and DUI. [place]. Tried Zoloft, Prozac and lithium with good response. Depakote andAbilify did not helped. TookVistaril and Ativan for anxiety which helped. No H/Osuicidal attempt or any paranoia.   Psychiatric Specialty Exam: Physical Exam  Review of Systems  Weight 176 lb (79.8 kg).There is no height or weight on file to calculate BMI.  General Appearance: NA  Eye Contact:  NA  Speech:  Clear and Coherent  Volume:  Normal  Mood:  Irritable  Affect:  NA  Thought Process:  Goal Directed  Orientation:  Full (Time, Place, and Person)  Thought Content:  Logical  Suicidal Thoughts:  No  Homicidal Thoughts:  No  Memory:  Immediate;   Good Recent;   Good Remote;   Good  Judgement:  Intact  Insight:  Present  Psychomotor Activity:  NA  Concentration:  Concentration: Fair and Attention Span: Fair  Recall:  Good  Fund of Knowledge:  Good  Language:  Good  Akathisia:  No  Handed:  Right  AIMS (if indicated):     Assets:  Communication Skills Desire for Improvement Housing Resilience Social Support Talents/Skills Transportation  ADL's:  Intact  Cognition:  WNL  Sleep:   fair. Disrupted sleep for 3 days      Assessment and Plan: Bipolar disorder type I.  Anxiety.  Discussed his sleep and racing thoughts for past  few days since he is doing the final progression of his friends trip.  He is hoping to leave on May 24 for at least 4 months.  I recommend to take hydroxyzine which had helped in the past.  He agreed with the plan.  He will start his regular exercise as weather is better.  I also recommend once he had blood work then he can fax to [place].  Continue Lamictal 200 mg twice a day and we will restart hydroxyzine 25 mg to take as needed for anxiety.  Recommended to call us back if is any question or any concern.  He like to have  another visit before he leaves for [place].  Follow-up in 3 months.  Follow Up Instructions:    I discussed the assessment and treatment plan with the patient. The patient was provided an opportunity to ask questions and all were answered. The patient agreed with the plan and demonstrated an understanding of the instructions.   The patient was advised to call back or seek an in-person evaluation if the symptoms worsen or if the condition fails to improve as anticipated.  I provided 16 minutes of non-face-to-face time during this encounter.   Cleotis Nipper, MD

## 2021-02-27 ENCOUNTER — Telehealth (INDEPENDENT_AMBULATORY_CARE_PROVIDER_SITE_OTHER): Payer: BLUE CROSS/BLUE SHIELD | Admitting: Psychiatry

## 2021-02-27 ENCOUNTER — Encounter (HOSPITAL_COMMUNITY): Payer: Self-pay | Admitting: Psychiatry

## 2021-02-27 ENCOUNTER — Other Ambulatory Visit: Payer: Self-pay

## 2021-02-27 DIAGNOSIS — F310 Bipolar disorder, current episode hypomanic: Secondary | ICD-10-CM | POA: Diagnosis not present

## 2021-02-27 DIAGNOSIS — F419 Anxiety disorder, unspecified: Secondary | ICD-10-CM

## 2021-02-27 MED ORDER — HYDROXYZINE HCL 25 MG PO TABS: ORAL_TABLET | ORAL | 0 refills | Status: AC

## 2021-02-27 MED ORDER — LAMOTRIGINE 200 MG PO TABS: ORAL_TABLET | ORAL | 0 refills | Status: AC

## 2021-02-27 NOTE — Progress Notes (Signed)
Virtual Visit via Telephone Note  I connected with Tom [name] on 02/27/21 at 10:00 AM EDT by telephone and verified that I am speaking with the correct person using two identifiers.  Location: Patient: Home Provider: Home Office   I discussed the limitations, risks, security and privacy concerns of performing an evaluation and management service by telephone and the availability of in person appointments. I also discussed with the patient that there may be a patient responsible charge related to this service. The patient expressed understanding and agreed to proceed.   History of Present Illness: Patient is evaluated by phone session.  He is excited as leaving [place] on May 24.  He is currently working with the [employer] and admitted sometimes gets irritable, frustrated and has to take few times hydroxyzine so he can sleep better.  But he is happy it will be another 1 week when he and working.  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  He admitted not able to do biking because he was busy at work.  He does not want to change the medication but like to keep the current dose and get another refill on hydroxyzine.  He will stay in [place] for 4-1/2 months.  Denies any crying spells, mania, anger, suicidal thoughts.  His appetite is okay.  He is hoping to do biking today as weather is good.  He has no tremors or rash.  Denies drinking or using any illegal substances.  Past Psychiatric History:Reviewed. H/Oimpulsive behavior, mania, delusions, grandiosity and punching in the walls.H/Olegal issues and charged for disorderly conduct and DUI. [place]. Tried Zoloft, Prozac and lithium with good response. Depakote andAbilify did not helped. TookVistaril and Ativan for anxiety which helped. No  H/Osuicidal attempt or any paranoia.   Psychiatric Specialty Exam: Physical Exam  Review of Systems  Weight 176 lb (79.8 kg).Body mass index is 23.87 kg/m.  General Appearance: NA  Eye Contact:  NA  Speech:  Normal Rate  Volume:  Normal  Mood:  Anxious  Affect:  NA  Thought Process:  Goal Directed  Orientation:  Full (Time, Place, and Person)  Thought Content:  WDL  Suicidal Thoughts:  No  Homicidal Thoughts:  No  Memory:  Immediate;   Good Recent;   Good Remote;   Good  Judgement:  Intact  Insight:  Present  Psychomotor Activity:  NA  Concentration:  Concentration: Fair and Attention Span: Fair  Recall:  Good  Fund of Knowledge:  Good  Language:  Good  Akathisia:  No  Handed:  Right  AIMS (if indicated):     Assets:  Communication Skills Desire for Improvement Housing Resilience Social Support Talents/Skills Transportation  ADL's:  Intact  Cognition:  WNL  Sleep:   fair      Assessment and Plan: Bipolar disorder type I.  Anxiety.  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  He is planning to leave for [place] on May 24.  He like to have his medication for 6 months as he is stable and has no issues or concerns and make sure that he has another until he comes back from [place].  Discussed medication side effects and benefits.  Continue lamotrigine 200 mg twice a day and hydroxyzine 25 mg to  take as needed for anxiety.  Recommended to call us back with any question or any concern.  We will follow up in 6 months.  Follow Up Instructions:    I discussed the assessment and treatment plan with the patient. The patient was provided an opportunity to ask questions and all were answered. The patient agreed with the plan and demonstrated an understanding of the instructions.   The patient was advised to call back or seek an in-person evaluation if the symptoms worsen or if the  condition fails to improve as anticipated.  I provided 19 minutes of non-face-to-face time during this encounter.   Cleotis Nipper, MD

## 2021-02-28 ENCOUNTER — Ambulatory Visit (HOSPITAL_COMMUNITY)
Admission: EM | Admit: 2021-02-28 | Discharge: 2021-03-01 | Disposition: A | Discharge status: Home / Self Care | Payer: BLUE CROSS/BLUE SHIELD

## 2021-02-28 ENCOUNTER — Other Ambulatory Visit: Payer: Self-pay

## 2021-02-28 ENCOUNTER — Ambulatory Visit (HOSPITAL_COMMUNITY)
Admission: EM | Admit: 2021-02-28 | Discharge: 2021-02-28 | Disposition: A | Discharge status: Home / Self Care | Payer: BLUE CROSS/BLUE SHIELD

## 2021-02-28 DIAGNOSIS — Z789 Other specified health status: Secondary | ICD-10-CM

## 2021-02-28 DIAGNOSIS — F310 Bipolar disorder, current episode hypomanic: Secondary | ICD-10-CM

## 2021-02-28 DIAGNOSIS — Z7289 Other problems related to lifestyle: Secondary | ICD-10-CM

## 2021-02-28 DIAGNOSIS — F319 Bipolar disorder, unspecified: Secondary | ICD-10-CM

## 2021-02-28 NOTE — BH Assessment (Addendum)
Tom [name] is a 60 year old male who presents voluntary and accompanied. Pt reported, he's just about to loose his mind. Pt reported, he's diagnosed with Bipolar linked to Dr. Lolly Mustache with [facility] for medication management. Pt reported, he's been dealing with this for decades. Pt reported, he feels that a manic episode is approaching and he feels he's in the danger zone. Pt reported, risky behaviors, recklessness, racing thoughts, increased energy. Pt reported, drinking a lot of alochol an hour and a half ago. Pt denies, SI, HI, AVH, self-injurious behaviors and access to wepaons. 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 Pt reports, if discharged he can contract for safety.   Determination of need: Routine.  *Pt declined for clincian to contact collaterals to obtain additional information.*  Redmond Pulling, MS, [facility], [facility] Triage Specialist [phone]

## 2021-02-28 NOTE — Discharge Instructions (Signed)
   Discharge recommendations:  Patient is to take medications as prescribed. Please see information for follow-up appointment with psychiatry and therapy. Please follow up with your primary care provider for all medical related needs.   Therapy: We recommend that patient participate in individual therapy to address mental health concerns.   Atypical antipsychotics: If you are prescribed an atypical antipsychotic, it is recommended that your height, weight, BMI, blood pressure, fasting lipid panel, and fasting blood sugar be monitored by your outpatient providers.  Safety:  The patient should abstain from use of illicit substances/drugs and abuse of any medications. If symptoms worsen or do not continue to improve or if the patient becomes actively suicidal or homicidal then it is recommended that the patient return to the closest hospital emergency department, the [facility], or call 911 for further evaluation and treatment. National Suicide Prevention Lifeline 1-800-SUICIDE or 1-800-273-8255.  

## 2021-02-28 NOTE — ED Provider Notes (Signed)
[facility] Medical Screening Exam  Patient Name: Tom [name] MRN: [id number] Date of Evaluation: 02/28/21 Chief Complaint:   Diagnosis:  Final diagnoses:  Bipolar affective disorder, current episode hypomanic (HCC)    History of Present illness: Tom [name] is a 60 y.o. male with a history of bipolar disorder who presents to [facility] voluntarily. Patient is followed by Dr. Lolly Mustache and was last seen on 02/27/2021. Patient states that he feels that he is on the edge of entering a manic episode. 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 He reports that playing pool (gambling) has been a previous risky behavior. He states that the played pool tonight, but did not play for money. He states that he drinks alcohol 5-6 days a week. States most days he may drinks 2 glasses of wine with dinner and then 1-2 beers. He states that some days he may have a total of 6-8 drinks. Patient denies suicidal ideations. He denies homicidal ideations. He denies auditory and visual hallucinations. No indication that he is responding to internal stimuli. Patient states that he does not feel that he needs medication changes at this time. He states that he came in for an evaluation because his wife asked him to. He states that he will contact Dr. Lolly Mustache if he feels that his symptoms are worsening. He states that he arrived at [facility] via cab and that he plans to call a cab for transportation home.   Psychiatric Specialty Exam  Presentation  General Appearance:Appropriate for Environment; Well Groomed  Eye Contact:Good  Speech:Clear and Coherent; Normal Rate  Speech Volume:Normal  Handedness:No data recorded  Mood and Affect  Mood:Anxious  Affect:Congruent   Thought Process  Thought Processes:Coherent; Goal Directed; Linear  Descriptions of Associations:Intact  Orientation:Full (Time, Place and Person)  Thought  Content:Logical    Hallucinations:None  Ideas of Reference:None  Suicidal Thoughts:No  Homicidal Thoughts:No   Sensorium  Memory:Immediate Good; Recent Good; Remote Good  Judgment:Intact  Insight:Good   Executive Functions  Concentration:Good  Attention Span:Good  Recall:Good  Fund of Knowledge:Good  Language:Good   Psychomotor Activity  Psychomotor Activity:Normal   Assets  Assets:Communication Skills; Desire for Improvement; Financial Resources/Insurance; Housing; Physical Health; Resilience; Social Support; Talents/Skills; Transportation   Sleep  Sleep:Fair  Number of hours: No data recorded  No data recorded  Physical Exam: Physical Exam Constitutional:      General: He is not in acute distress.    Appearance: He is not ill-appearing, toxic-appearing or diaphoretic.  HENT:     Head: Normocephalic.     Right Ear: External ear normal.     Left Ear: External ear normal.  Eyes:     Pupils: Pupils are equal, round, and reactive to light.  Cardiovascular:     Rate and Rhythm: Normal rate.  Pulmonary:     Effort: Pulmonary effort is normal. No respiratory distress.  Musculoskeletal:        General: Normal range of motion.  Neurological:     Mental Status: He is alert and oriented to person, place, and time.  Psychiatric:        Mood and Affect: Mood is anxious.        Speech: Speech normal.        Behavior: Behavior is cooperative.        Thought Content: Thought content is not paranoid or delusional. Thought content does not include homicidal or suicidal ideation. Thought content does  not include suicidal plan.    Review of Systems  Psychiatric/Behavioral: Positive for substance abuse. Negative for depression, hallucinations, memory loss and suicidal ideas. The patient is nervous/anxious and has insomnia.    Blood pressure (!) 151/93, pulse 75, temperature 98 F (36.7 C), resp. rate 18, SpO2 100 %. There is no height or weight on file to  calculate BMI.  Musculoskeletal: Strength & Muscle Tone: within normal limits Gait & Station: normal Patient leans: N/A   Suicide Risk:  Minimal: No identifiable suicidal ideation.  Patients presenting with no risk factors but with morbid ruminations; may be classified as minimal risk based on the severity of the depressive symptoms   [facility] MSE Discharge Disposition for Follow up and Recommendations: Based on my evaluation the patient does not appear to have an emergency medical condition and can be discharged with resources and follow up care in outpatient services for Medication Management and Individual Therapy   Patient states that he does not feel that he needs medication changes at this time. He states that he came in for an evaluation because his wife asked him to. He states that he will contact Dr. Lolly Mustache if he feels that his symptoms are worsening. He states that he arrived at [facility] via cab and that he plans to call a cab for transportation home.    Jackelyn Poling, NP 02/28/2021, 4:23 AM

## 2021-03-01 DIAGNOSIS — Z7289 Other problems related to lifestyle: Secondary | ICD-10-CM | POA: Diagnosis not present

## 2021-03-01 DIAGNOSIS — F319 Bipolar disorder, unspecified: Secondary | ICD-10-CM | POA: Diagnosis not present

## 2021-03-01 NOTE — ED Provider Notes (Signed)
[facility] Medical Screening Exam  Patient Name: Tom [name] MRN: [id number] Date of Evaluation: 03/01/21 Chief Complaint:   Diagnosis:  Final diagnoses:  Bipolar affective disorder, remission status unspecified (HCC)  Alcohol use    History of Present illness: Tom [name] is a 60 y.o. male with a history of bipolar disorder, anxiety, and alcohol use who presents to [facility] voluntarily with law enforcement. Patient also presented to [facility] on 02/28/21 with similar presentation. Patient reports that tonight he was at an adult entertainment club and decided to contact law enforcement before he participated in any "risky behaviors" other than drinking alcohol. He is afraid that he may be about to enter a manic episode. Patient appears to be slightly intoxicated. He states that he drinks alcohol 5-6 days a week. States most days he may drinks 2 glasses of wine with dinner and then 1-2 beers. He states that some days he may have a total of 6-8 drinks. 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  They take an annual trip to [place] for several months and have trip planned for later this month. He states that he has made arrangements to ensure that he has enough medication while in [place]. Patient denies suicidal ideations. He denies homicidal ideations. He denies auditoyr and visual hallucinations. No indication that he is responding to internal stimuli. At this time, he is not interested in medication changes. He does not feel he needs overnight observation. Patient encouraged to follow up with Dr. Lolly Mustache on 03/01/2021. TTS contacted patient's wife who reported no concerns for the patient's safety. Patient's wife provided transportation home.    On evaluation patient is alert and oriented x 4, pleasant, and cooperative. Speech is clear and coherent. Mood is anxious and affect is congruent with mood. Thought process is coherent  and thought content is logical. Denies auditory and visual hallucinations. No indication that patient is responding to internal stimuli. No evidence of delusional thought content. Denies suicidal ideations. Denies homicidal ideations. Patient is future oriented and goal directed.  Psychiatric Specialty Exam  Presentation  General Appearance:Appropriate for Environment; Well Groomed  Eye Contact:Good  Speech:Clear and Coherent; Normal Rate  Speech Volume:Normal  Handedness:No data recorded  Mood and Affect  Mood:Anxious  Affect:Congruent   Thought Process  Thought Processes:Coherent; Goal Directed; Linear  Descriptions of Associations:Intact  Orientation:Full (Time, Place and Person)  Thought Content:Logical    Hallucinations:None  Ideas of Reference:None  Suicidal Thoughts:No  Homicidal Thoughts:No   Sensorium  Memory:Immediate Good; Recent Good; Remote Good  Judgment:Intact  Insight:Present   Executive Functions  Concentration:Good  Attention Span:Good  Recall:Good  Fund of Knowledge:Good  Language:Good   Psychomotor Activity  Psychomotor Activity:Normal   Assets  Assets:Communication Skills; Desire for Improvement; Financial Resources/Insurance; Housing; Physical Health; Resilience; Social Support; Talents/Skills; Transportation   Sleep  Sleep:Fair  Number of hours: No data recorded  No data recorded  Physical Exam: Physical Exam Constitutional:      General: He is not in acute distress.    Appearance: He is not ill-appearing, toxic-appearing or diaphoretic.  HENT:     Head: Normocephalic.     Right Ear: External ear normal.     Left Ear: External ear normal.  Eyes:     Pupils: Pupils are equal, round, and reactive to light.  Cardiovascular:     Rate and Rhythm: Normal rate.  Pulmonary:     Effort: Pulmonary effort is normal. No  respiratory distress.  Musculoskeletal:        General: Normal range of motion.  Skin:     General: Skin is warm and dry.  Neurological:     Mental Status: He is alert and oriented to person, place, and time.  Psychiatric:        Mood and Affect: Mood is anxious.        Speech: Speech normal.        Behavior: Behavior is cooperative.        Thought Content: Thought content is not paranoid or delusional. Thought content does not include homicidal or suicidal ideation. Thought content does not include suicidal plan.    Review of Systems  Constitutional: Negative for chills, diaphoresis, fever, malaise/fatigue and weight loss.  HENT: Negative for congestion.   Respiratory: Negative for cough and shortness of breath.   Cardiovascular: Negative for chest pain and palpitations.  Gastrointestinal: Negative for diarrhea, nausea and vomiting.  Neurological: Negative for dizziness and seizures.  Psychiatric/Behavioral: Positive for substance abuse (alcohol). Negative for depression, hallucinations, memory loss and suicidal ideas. The patient is nervous/anxious and has insomnia.   All other systems reviewed and are negative.  Blood pressure (!) 153/96, pulse 81, temperature 98.6 F (37 C), temperature source Oral, resp. rate 20, SpO2 96 %. There is no height or weight on file to calculate BMI.  Musculoskeletal: Strength & Muscle Tone: within normal limits Gait & Station: normal Patient leans: N/A   [facility] MSE Discharge Disposition for Follow up and Recommendations: Based on my evaluation the patient does not appear to have an emergency medical condition and can be discharged with resources and follow up care in outpatient services for Medication Management and Individual Therapy   Jackelyn Poling, NP 03/01/2021, 12:11 AM
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
## 2021-03-01 NOTE — Discharge Instructions (Addendum)
°  Discharge recommendations:  °Patient is to take medications as prescribed. °Please see information for follow-up appointment with psychiatry and therapy. °Please follow up with your primary care provider for all medical related needs.  ° °Therapy: We recommend that patient participate in individual therapy to address mental health concerns. ° ° ° ° °Safety:  °The patient should abstain from use of illicit substances/drugs and abuse of any medications. °If symptoms worsen or do not continue to improve or if the patient becomes actively suicidal or homicidal then it is recommended that the patient return to the closest hospital emergency department, the [facility], or call 911 for further evaluation and treatment. °National Suicide Prevention Lifeline 1-800-SUICIDE or 1-800-273-8255.  °
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
## 2021-08-30 ENCOUNTER — Encounter (HOSPITAL_COMMUNITY): Payer: BLUE CROSS/BLUE SHIELD | Admitting: Psychiatry

## 2021-08-30 ENCOUNTER — Encounter (HOSPITAL_COMMUNITY): Payer: Self-pay

## 2021-08-30 ENCOUNTER — Other Ambulatory Visit: Payer: Self-pay

## 2021-08-31 NOTE — Progress Notes (Signed)
No show

## 2022-04-27 ENCOUNTER — Telehealth (HOSPITAL_COMMUNITY): Payer: BLUE CROSS/BLUE SHIELD | Admitting: Psychiatry
